# Patient Record
Sex: Female | Born: 1975 | Race: Black or African American | Hispanic: No | Marital: Single | Smoking: Current every day smoker
Health system: Southern US, Community
[De-identification: ages and names within clinical notes are randomized; demographics above are authoritative.]

## PROBLEM LIST (undated history)

## (undated) DIAGNOSIS — F53 Postpartum depression: Secondary | ICD-10-CM

## (undated) DIAGNOSIS — J45909 Unspecified asthma, uncomplicated: Secondary | ICD-10-CM

## (undated) DIAGNOSIS — F99 Mental disorder, not otherwise specified: Secondary | ICD-10-CM

## (undated) DIAGNOSIS — M1990 Unspecified osteoarthritis, unspecified site: Secondary | ICD-10-CM

## (undated) HISTORY — PX: HERNIA REPAIR: SHX51

## (undated) HISTORY — DX: Mental disorder, not otherwise specified: F99

## (undated) HISTORY — DX: Postpartum depression: F53.0

---

## 2020-02-24 ENCOUNTER — Inpatient Hospital Stay (HOSPITAL_COMMUNITY): Payer: Medicaid Other

## 2020-02-24 ENCOUNTER — Other Ambulatory Visit: Payer: Self-pay

## 2020-02-24 ENCOUNTER — Encounter (HOSPITAL_COMMUNITY): Payer: Self-pay | Admitting: Obstetrics & Gynecology

## 2020-02-24 ENCOUNTER — Inpatient Hospital Stay (HOSPITAL_COMMUNITY)
Admission: AD | Admit: 2020-02-24 | Discharge: 2020-02-24 | Disposition: A | Discharge status: Home / Self Care | Payer: Medicaid Other | Attending: Obstetrics & Gynecology | Admitting: Obstetrics & Gynecology

## 2020-02-24 DIAGNOSIS — O2342 Unspecified infection of urinary tract in pregnancy, second trimester: Secondary | ICD-10-CM | POA: Insufficient documentation

## 2020-02-24 DIAGNOSIS — O26891 Other specified pregnancy related conditions, first trimester: Secondary | ICD-10-CM | POA: Insufficient documentation

## 2020-02-24 DIAGNOSIS — O09521 Supervision of elderly multigravida, first trimester: Secondary | ICD-10-CM | POA: Insufficient documentation

## 2020-02-24 DIAGNOSIS — O4691 Antepartum hemorrhage, unspecified, first trimester: Secondary | ICD-10-CM | POA: Diagnosis not present

## 2020-02-24 DIAGNOSIS — A599 Trichomoniasis, unspecified: Secondary | ICD-10-CM | POA: Insufficient documentation

## 2020-02-24 DIAGNOSIS — O98311 Other infections with a predominantly sexual mode of transmission complicating pregnancy, first trimester: Secondary | ICD-10-CM | POA: Insufficient documentation

## 2020-02-24 DIAGNOSIS — M545 Low back pain: Secondary | ICD-10-CM | POA: Insufficient documentation

## 2020-02-24 DIAGNOSIS — O2341 Unspecified infection of urinary tract in pregnancy, first trimester: Secondary | ICD-10-CM | POA: Diagnosis not present

## 2020-02-24 DIAGNOSIS — O209 Hemorrhage in early pregnancy, unspecified: Secondary | ICD-10-CM

## 2020-02-24 DIAGNOSIS — O3680X0 Pregnancy with inconclusive fetal viability, not applicable or unspecified: Secondary | ICD-10-CM | POA: Diagnosis not present

## 2020-02-24 DIAGNOSIS — Z3A13 13 weeks gestation of pregnancy: Secondary | ICD-10-CM | POA: Insufficient documentation

## 2020-02-24 LAB — ABO/RH: ABO/RH(D): A POS

## 2020-02-24 LAB — CBC
HCT: 37 % (ref 36.0–46.0)
Hemoglobin: 12.5 g/dL (ref 12.0–15.0)
MCH: 30.5 pg (ref 26.0–34.0)
MCHC: 33.8 g/dL (ref 30.0–36.0)
MCV: 90.2 fL (ref 80.0–100.0)
Platelets: 259 10*3/uL (ref 150–400)
RBC: 4.1 MIL/uL (ref 3.87–5.11)
RDW: 12.7 % (ref 11.5–15.5)
WBC: 7.9 10*3/uL (ref 4.0–10.5)
nRBC: 0 % (ref 0.0–0.2)

## 2020-02-24 LAB — URINALYSIS, ROUTINE W REFLEX MICROSCOPIC
Bilirubin Urine: NEGATIVE
Glucose, UA: NEGATIVE mg/dL
Ketones, ur: NEGATIVE mg/dL
Nitrite: POSITIVE — AB
Protein, ur: 100 mg/dL — AB
Specific Gravity, Urine: 1.028 (ref 1.005–1.030)
pH: 5 (ref 5.0–8.0)

## 2020-02-24 LAB — WET PREP, GENITAL
Clue Cells Wet Prep HPF POC: NONE SEEN
Sperm: NONE SEEN
Yeast Wet Prep HPF POC: NONE SEEN

## 2020-02-24 LAB — POCT PREGNANCY, URINE: Preg Test, Ur: POSITIVE — AB

## 2020-02-24 LAB — HCG, QUANTITATIVE, PREGNANCY: hCG, Beta Chain, Quant, S: 16930 m[IU]/mL — ABNORMAL HIGH (ref <5)

## 2020-02-24 MED ORDER — CEPHALEXIN 500 MG PO CAPS
500.0000 mg | ORAL_CAPSULE | Freq: Four times a day (QID) | ORAL | 0 refills | Status: DC
Start: 2020-02-24 — End: 2020-03-15

## 2020-02-24 MED ORDER — METRONIDAZOLE 500 MG PO TABS
2000.0000 mg | ORAL_TABLET | Freq: Once | ORAL | Status: AC
  Administered 2020-02-24: 2000 mg via ORAL
  Filled 2020-02-24: qty 4

## 2020-02-24 NOTE — MAU Note (Signed)
Bailey Delgado is a 44 y.o. 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 Did not see any bleeding when using bathroom in MAU. Having some lower back pain.  LMP: 11/23/2019  Onset of complaint: today  Pain score: 1/10  Vitals:   02/24/20 1614  BP: 134/89  Pulse: 97  Resp: 16  Temp: 98.3 F (36.8 C)  SpO2: 99%     Lab orders placed from triage: UPT UA

## 2020-02-24 NOTE — Discharge Instructions (Signed)
Trichomoniasis Trichomoniasis is an STI (sexually transmitted infection) that can affect both women and men. In women, the outer area of the female genitalia (vulva) and the vagina are affected. In men, mainly the penis is affected, but the prostate and other reproductive organs can also be involved.  This condition can be treated with medicine. It often has no symptoms (is asymptomatic), especially in men. If not treated, trichomoniasis can last for months or years. What are the causes? This condition is caused by a parasite called Trichomonas vaginalis. Trichomoniasis most often spreads from person to person (is contagious) through sexual contact. What increases the risk? The following factors may make you more likely to develop this condition:  Having unprotected sex.  Having sex with a partner who has trichomoniasis.  Having multiple sexual partners.  Having had previous trichomoniasis infections or other STIs. What are the signs or symptoms? In women, symptoms of trichomoniasis include:  Abnormal vaginal discharge that is clear, white, gray, or yellow-green and foamy and has an unusual "fishy" odor.  Itching and irritation of the vagina and vulva.  Burning or pain during urination or sex.  Redness and swelling of the genitals. In men, symptoms of trichomoniasis include:  Penile discharge that may be foamy or contain pus.  Pain in the penis. This may happen only when urinating.  Itching or irritation inside the penis.  Burning after urination or ejaculation. How is this diagnosed? In women, this condition may be found during a routine Pap test or physical exam. It may be found in men during a routine physical exam. Your health care provider may do tests to help diagnose this infection, such as:  Urine tests (men and women).  The following in women: ? Testing the pH of the vagina. ? A vaginal swab test that checks for the Trichomonas vaginalis parasite. ? Testing vaginal  secretions. Your health care provider may test you for other STIs, including HIV (human immunodeficiency virus). How is this treated? This condition is treated with medicine taken by mouth (orally), such as metronidazole or tinidazole, to fight the infection. Your sexual partner(s) also need to be tested and treated.  If you are a woman and you plan to become pregnant or think you may be pregnant, tell your health care provider right away. Some medicines that are used to treat the infection should not be taken during pregnancy. Your health care provider may recommend over-the-counter medicines or creams to help relieve itching or irritation. You may be tested for infection again 3 months after treatment. Follow these instructions at home:  Take and use over-the-counter and prescription medicines, including creams, only as told by your health care provider.  Take your antibiotic medicine as told by your health care provider. Do not stop taking the antibiotic even if you start to feel better.  Do not have sex until 7-10 days after you finish your medicine, or until your health care provider approves. Ask your health care provider when you may start to have sex again.  (Women) Do not douche or wear tampons while you have the infection.  Discuss your infection with your sexual partner(s). Make sure that your partner gets tested and treated, if necessary.  Keep all follow-up visits as told by your health care provider. This is important. How is this prevented?   Use condoms every time you have sex. Using condoms correctly and consistently can help protect against STIs.  Avoid having multiple sexual partners.  Talk with your sexual partner about any   symptoms that either of you may have, as well as any history of STIs.  Get tested for STIs and STDs (sexually transmitted diseases) before you have sex. Ask your partner to do the same.  Do not have sexual contact if you have symptoms of  trichomoniasis or another STI. Contact a health care provider if:  You still have symptoms after you finish your medicine.  You develop pain in your abdomen.  You have pain when you urinate.  You have bleeding after sex.  You develop a rash.  You feel nauseous or you vomit.  You plan to become pregnant or think you may be pregnant. Summary  Trichomoniasis is an STI (sexually transmitted infection) that can affect both women and men.  This condition often has no symptoms (is asymptomatic), especially in men.  Without treatment, this condition can last for months or years.  You should not have sex until 7-10 days after you finish your medicine, or until your health care provider approves. Ask your health care provider when you may start to have sex again.  Discuss your infection with your sexual partner(s). Make sure that your partner gets tested and treated, if necessary. This information is not intended to replace advice given to you by your health care provider. Make sure you discuss any questions you have with your health care provider. Document Revised: 05/10/2018 Document Reviewed: 05/10/2018 Elsevier Patient Education  2020 [employer]   Pregnancy and Urinary Tract Infection  A urinary tract infection (UTI) is an infection of any part of the urinary tract. This includes the kidneys, the tubes that connect your kidneys to your bladder (ureters), the bladder, and the tube that carries urine out of your body (urethra). These organs make, store, and get rid of urine in the body. Your health care provider may use other names to describe the infection. An upper UTI affects the ureters and kidneys (pyelonephritis). A lower UTI affects the bladder (cystitis) and urethra (urethritis). Most urinary tract infections are caused by bacteria in your genital area, around the entrance to your urinary tract (urethra). These bacteria grow and cause irritation and inflammation of your urinary  tract. You are more likely to develop a UTI during pregnancy because the physical and hormonal changes your body goes through can make it easier for bacteria to get into your urinary tract. Your growing baby also puts pressure on your bladder and can affect urine flow. It is important to recognize and treat UTIs in pregnancy because of the risk of serious complications for both you and your baby. How does this affect me? Symptoms of a UTI include:  Needing to urinate right away (urgently).  Frequent urination or passing small amounts of urine frequently.  Pain or burning with urination.  Blood in the urine.  Urine that smells bad or unusual.  Trouble urinating.  Cloudy urine.  Pain in the abdomen or lower back.  Vaginal discharge. You may also have:  Vomiting or a decreased appetite.  Confusion.  Irritability or tiredness.  A fever.  Diarrhea. How does this affect my baby? An untreated UTI during pregnancy could lead to a kidney infection or a systemic infection, which can cause health problems that could affect your baby. Possible complications of an untreated UTI include:  Giving birth to your baby before 37 weeks of pregnancy (premature).  Having a baby with a low birth weight.  Developing high blood pressure during pregnancy (preeclampsia).  Having a low hemoglobin level (anemia). What can I  do to lower my risk? To prevent a UTI:  Go to the bathroom as soon as you feel the need. Do not hold urine for long periods of time.  Always wipe from front to back, especially after a bowel movement. Use each tissue one time when you wipe.  Empty your bladder after sex.  Keep your genital area dry.  Drink 6-10 glasses of water each day.  Do not douche or use deodorant sprays. How is this treated? Treatment for this condition may include:  Antibiotic medicines that are safe to take during pregnancy.  Other medicines to treat less common causes of UTI. Follow these  instructions at home:  If you were prescribed an antibiotic medicine, take it as told by your health care provider. Do not stop using the antibiotic even if you start to feel better.  Keep all follow-up visits as told by your health care provider. This is important. Contact a health care provider if:  Your symptoms do not improve or they get worse.  You have abnormal vaginal discharge. Get help right away if you:  Have a fever.  Have nausea and vomiting.  Have back or side pain.  Feel contractions in your uterus.  Have lower belly pain.  Have a gush of fluid from your vagina.  Have blood in your urine. Summary  A urinary tract infection (UTI) is an infection of any part of the urinary tract, which includes the kidneys, ureters, bladder, and urethra.  Most urinary tract infections are caused by bacteria in your genital area, around the entrance to your urinary tract (urethra).  You are more likely to develop a UTI during pregnancy.  If you were prescribed an antibiotic medicine, take it as told by your health care provider. Do not stop using the antibiotic even if you start to feel better. This information is not intended to replace advice given to you by your health care provider. Make sure you discuss any questions you have with your health care provider. Document Revised: 11/18/2018 Document Reviewed: 06/30/2018 Elsevier Patient Education  2020 [employer].

## 2020-02-24 NOTE — MAU Provider Note (Signed)
History     CSN: [id number]  Arrival date and time: 02/24/20 1551   Chief Complaint  Patient presents with   Back Pain   Vaginal Bleeding   HPI Bailey Delgado is a 44 y.o. [id number] at approximately [redacted]w[redacted]d by unsure LMP who presents with bleeding for the last 2 days that started while she was working. She passed one small clot. She also reports some intermittent lower back pain that she rates a 1/10. She has not tried anything for the pain. She has not been seen anywhere in the pregnancy so far.    OB History    Gravida  4   Para  1   Term      Preterm  1   AB  1   Living  1     SAB  1   TAB      Ectopic      Multiple      Live Births  2           No past medical history on file.  History reviewed. No pertinent surgical history.  No family history on file.  Social History   Tobacco Use   Smoking status: Not on file  Substance Use Topics   Alcohol use: Not on file   Drug use: Not on file    Allergies: No Known Allergies  No medications prior to admission.    Review of Systems  Constitutional: Negative.  Negative for fatigue and fever.  HENT: Negative.   Respiratory: Negative.  Negative for shortness of breath.   Cardiovascular: Negative.  Negative for chest pain.  Gastrointestinal: Negative for abdominal pain, constipation, diarrhea, nausea and vomiting.  Genitourinary: Positive for vaginal bleeding. Negative for dysuria and vaginal discharge.  Musculoskeletal: Positive for back pain.  Neurological: Negative.  Negative for dizziness and headaches.   Physical Exam   Blood pressure 134/89, pulse 97, temperature 98.3 F (36.8 C), temperature source Oral, resp. rate 16, height 5\' 6"  (1.676 m), weight 108.3 kg, last menstrual period 11/23/2019, SpO2 99 %.  Physical Exam Vitals and nursing note reviewed.  Constitutional:      General: She is not in acute distress.    Appearance: She is well-developed.  HENT:     Head: Normocephalic.   Eyes:     Pupils: Pupils are equal, round, and reactive to light.  Cardiovascular:     Rate and Rhythm: Normal rate and regular rhythm.     Heart sounds: Normal heart sounds.  Pulmonary:     Effort: Pulmonary effort is normal. No respiratory distress.     Breath sounds: Normal breath sounds.  Abdominal:     General: Bowel sounds are normal. There is no distension.     Palpations: Abdomen is soft.     Tenderness: There is no abdominal tenderness.  Genitourinary:    Comments: SSE: scant bleeding in vaginal vault Skin:    General: Skin is warm and dry.  Neurological:     Mental Status: She is alert and oriented to person, place, and time.  Psychiatric:        Behavior: Behavior normal.        Thought Content: Thought content normal.        Judgment: Judgment normal.     MAU Course  Procedures Results for orders placed or performed during the hospital encounter of 02/24/20 (from the past 24 hour(s))  Pregnancy, urine POC     Status: Abnormal   Collection Time: 02/24/20  4:08  PM  Result Value Ref Range   Preg Test, Ur POSITIVE (A) NEGATIVE  Urinalysis, Routine w reflex microscopic     Status: Abnormal   Collection Time: 02/24/20  4:10 PM  Result Value Ref Range   Color, Urine AMBER (A) YELLOW   APPearance HAZY (A) CLEAR   Specific Gravity, Urine 1.028 1.005 - 1.030   pH 5.0 5.0 - 8.0   Glucose, UA NEGATIVE NEGATIVE mg/dL   Hgb urine dipstick LARGE (A) NEGATIVE   Bilirubin Urine NEGATIVE NEGATIVE   Ketones, ur NEGATIVE NEGATIVE mg/dL   Protein, ur 919 (A) NEGATIVE mg/dL   Nitrite POSITIVE (A) NEGATIVE   Leukocytes,Ua SMALL (A) NEGATIVE   RBC / HPF 11-20 0 - 5 RBC/hpf   WBC, UA 21-50 0 - 5 WBC/hpf   Bacteria, UA MANY (A) NONE SEEN   Squamous Epithelial / LPF 6-10 0 - 5   Mucus PRESENT    Hyaline Casts, UA PRESENT   ABO/Rh     Status: None   Collection Time: 02/24/20  4:29 PM  Result Value Ref Range   ABO/RH(D) A POS    No rh immune globuloin      NOT A RH IMMUNE  GLOBULIN CANDIDATE, PT RH POSITIVE Performed at [facility], [address], [place], [place] [address]   CBC     Status: None   Collection Time: 02/24/20  4:29 PM  Result Value Ref Range   WBC 7.9 4.0 - 10.5 K/uL   RBC 4.10 3.87 - 5.11 MIL/uL   Hemoglobin 12.5 12.0 - 15.0 g/dL   HCT 00.4 36 - 46 %   MCV 90.2 80.0 - 100.0 fL   MCH 30.5 26.0 - 34.0 pg   MCHC 33.8 30.0 - 36.0 g/dL   RDW 59.9 77.4 - 14.2 %   Platelets 259 150 - 400 K/uL   nRBC 0.0 0.0 - 0.2 %  hCG, quantitative, pregnancy     Status: Abnormal   Collection Time: 02/24/20  4:29 PM  Result Value Ref Range   hCG, Beta Chain, Quant, S 16,930 (H) <5 mIU/mL  Wet prep, genital     Status: Abnormal   Collection Time: 02/24/20  4:40 PM   Specimen: Vaginal  Result Value Ref Range   Yeast Wet Prep HPF POC NONE SEEN NONE SEEN   Trich, Wet Prep PRESENT (A) NONE SEEN   Clue Cells Wet Prep HPF POC NONE SEEN NONE SEEN   WBC, Wet Prep HPF POC MODERATE (A) NONE SEEN   Sperm NONE SEEN    CLINICAL DATA: Spotting lower back pain, positive urine pregnancy test  EXAM: OBSTETRIC <14 WK [place] AND TRANSVAGINAL OB US  TECHNIQUE: Both transabdominal and transvaginal ultrasound examinations were performed for complete evaluation of the gestation as well as the maternal uterus, adnexal regions, and pelvic cul-de-sac. Transvaginal technique was performed to assess early pregnancy.  COMPARISON: None.  FINDINGS: Intrauterine gestational sac: Single with internal probable debris.  Yolk sac: Not Visualized.  Embryo: Not Visualized.  MSD: 25 mm 7 w 3 d  Subchorionic hemorrhage: There is a small subchorionic hemorrhage.  Maternal uterus/adnexae: Normal appearing ovaries. No free fluid in the cul-de-sac.  IMPRESSION: Probable early intrauterine gestational sac, but no yolk sac, fetal pole, or cardiac activity yet visualized. Recommend follow-up quantitative B-HCG levels and follow-up US in 14 days to assess viability. This  recommendation follows SRU consensus guidelines: Diagnostic Criteria for Nonviable Pregnancy Early in the First Trimester. N Engl J Med 2013;  086:7619-50.   Electronically Signed By: Jonna Clark M.D. On: 02/24/2020 17:30  MDM Unable to doppler FHT- will rule out ectopic UA, UPT, UC CBC, HCG ABO/Rh- A Pos Wet prep and gc/chlamydia US OB Comp Less 14 weeks with Transvaginal  Metronidazole 2g  Consulted with Dr. Macon Large regarding ultrasound and HCG results- MD reviewed images and suspicious of failed pregnancy. Recommends repeat u/s in a week for confirmation  Assessment and Plan   1. Pregnancy, location unknown   2. Vaginal bleeding affecting early pregnancy   3. Trichomoniasis   4. Urinary tract infection in mother during first trimester of pregnancy    -Discharge home in stable condition -Rx for keflex sent to patient's pharmacy -First trimester precautions discussed -Patient advised to follow-up with [facility] in 1 week for repeat ultrasound, order placed -Patient may return to MAU as needed or if her condition were to change or worsen   Rolm Bookbinder CNM 02/24/2020, 6:28 PM

## 2020-02-26 ENCOUNTER — Inpatient Hospital Stay (HOSPITAL_COMMUNITY): Payer: Medicaid Other

## 2020-02-26 ENCOUNTER — Encounter (HOSPITAL_COMMUNITY): Payer: Self-pay | Admitting: Obstetrics & Gynecology

## 2020-02-26 ENCOUNTER — Inpatient Hospital Stay (HOSPITAL_COMMUNITY)
Admission: AD | Admit: 2020-02-26 | Discharge: 2020-02-26 | Disposition: A | Discharge status: Home / Self Care | Payer: Medicaid Other | Attending: Obstetrics & Gynecology | Admitting: Obstetrics & Gynecology

## 2020-02-26 DIAGNOSIS — O039 Complete or unspecified spontaneous abortion without complication: Secondary | ICD-10-CM | POA: Insufficient documentation

## 2020-02-26 DIAGNOSIS — F17210 Nicotine dependence, cigarettes, uncomplicated: Secondary | ICD-10-CM | POA: Diagnosis not present

## 2020-02-26 DIAGNOSIS — O209 Hemorrhage in early pregnancy, unspecified: Secondary | ICD-10-CM | POA: Diagnosis present

## 2020-02-26 HISTORY — DX: Unspecified osteoarthritis, unspecified site: M19.90

## 2020-02-26 HISTORY — DX: Unspecified asthma, uncomplicated: J45.909

## 2020-02-26 LAB — HEMOGLOBIN AND HEMATOCRIT, BLOOD
HCT: 34.5 % — ABNORMAL LOW (ref 36.0–46.0)
Hemoglobin: 11.7 g/dL — ABNORMAL LOW (ref 12.0–15.0)

## 2020-02-26 LAB — CULTURE, OB URINE: Culture: >=100000 — AB

## 2020-02-26 LAB — GC/CHLAMYDIA PROBE AMP (~~LOC~~) NOT AT ARMC
Chlamydia: NEGATIVE
Comment: NEGATIVE
Comment: NORMAL
Neisseria Gonorrhea: NEGATIVE

## 2020-02-26 LAB — HCG, QUANTITATIVE, PREGNANCY: hCG, Beta Chain, Quant, S: 10371 m[IU]/mL — ABNORMAL HIGH (ref <5)

## 2020-02-26 MED ORDER — KETOROLAC TROMETHAMINE 30 MG/ML IJ SOLN
30.0000 mg | Freq: Once | INTRAMUSCULAR | Status: AC
  Administered 2020-02-26: 30 mg via INTRAVENOUS
  Filled 2020-02-26: qty 1

## 2020-02-26 MED ORDER — MISOPROSTOL 200 MCG PO TABS
1000.0000 ug | ORAL_TABLET | Freq: Once | ORAL | Status: AC
  Administered 2020-02-26: 1000 ug via BUCCAL
  Filled 2020-02-26: qty 5

## 2020-02-26 MED ORDER — HYDROMORPHONE HCL 1 MG/ML IJ SOLN
1.0000 mg | Freq: Once | INTRAMUSCULAR | Status: AC
  Administered 2020-02-26: 1 mg via INTRAMUSCULAR
  Filled 2020-02-26: qty 1

## 2020-02-26 MED ORDER — OXYCODONE HCL 5 MG PO TABS: 5.0000 mg | ORAL_TABLET | Freq: Three times a day (TID) | ORAL | 0 refills | Status: DC | PRN

## 2020-02-26 MED ORDER — OXYCODONE HCL 5 MG PO TABS: 5.0000 mg | ORAL_TABLET | Freq: Three times a day (TID) | ORAL | 0 refills | Status: AC | PRN

## 2020-02-26 NOTE — Discharge Instructions (Signed)
Return to care  °· If you have heavier bleeding that soaks through more that 2 pads per hour for an hour or more °· If you bleed so much that you feel like you might pass out or you do pass out °· If you have significant abdominal pain that is not improved with Tylenol  °· If you develop a fever > 100.5 ° ° °Miscarriage °A miscarriage is the loss of an unborn baby (fetus) before the 20th week of pregnancy. Most miscarriages happen during the first 3 months of pregnancy. Sometimes, a miscarriage can happen before a woman knows that she is pregnant. °Having a miscarriage can be an emotional experience. If you have had a miscarriage, talk with your health care provider about any questions you may have about miscarrying, the grieving process, and your plans for future pregnancy. °What are the causes? °A miscarriage may be caused by: °· Problems with the genes or chromosomes of the fetus. These problems make it impossible for the baby to develop normally. They are often the result of random errors that occur early in the development of the baby, and are not passed from parent to child (not inherited). °· Infection of the cervix or uterus. °· Conditions that affect hormone balance in the body. °· Problems with the cervix, such as the cervix opening and thinning before pregnancy is at term (cervical insufficiency). °· Problems with the uterus. These may include: °? A uterus with an abnormal shape. °? Fibroids in the uterus. °? Congenital abnormalities. These are problems that were present at birth. °· Certain medical conditions. °· Smoking, drinking alcohol, or using drugs. °· Injury (trauma). °In many cases, the cause of a miscarriage is not known. °What are the signs or symptoms? °Symptoms of this condition include: °· Vaginal bleeding or spotting, with or without cramps or pain. °· Pain or cramping in the abdomen or lower back. °· Passing fluid, tissue, or blood clots from the vagina. °How is this diagnosed? °This  condition may be diagnosed based on: °· A physical exam. °· Ultrasound. °· Blood tests. °· Urine tests. °How is this treated? °Treatment for a miscarriage is sometimes not necessary if you naturally pass all the tissue that was in your uterus. If necessary, this condition may be treated with: °· Dilation and curettage (D&C). This is a procedure in which the cervix is stretched open and the lining of the uterus (endometrium) is scraped. This is done only if tissue from the fetus or placenta remains in the body (incomplete miscarriage). °· Medicines, such as: °? Antibiotic medicine, to treat infection. °? Medicine to help the body pass any remaining tissue. °? Medicine to reduce (contract) the size of the uterus. These medicines may be given if you have a lot of bleeding. °If you have Rh negative blood and your baby was Rh positive, you will need a shot of a medicine called Rh immunoglobulinto protect your future babies from Rh blood problems. "Rh-negative" and "Rh-positive" refer to whether or not the blood has a specific protein found on the surface of red blood cells (Rh factor). °Follow these instructions at home: °Medicines ° °· Take over-the-counter and prescription medicines only as told by your health care provider. °· If you were prescribed antibiotic medicine, take it as told by your health care provider. Do not stop taking the antibiotic even if you start to feel better. °· Do not take NSAIDs, such as aspirin and ibuprofen, unless they are approved by your health care provider. These   care provider. These medicines can cause bleeding. Activity  Rest as directed. Ask your health care provider what activities are safe for you.  Have someone help with home and family responsibilities during this time. General instructions  Keep track of the number of sanitary pads you use each day and how soaked (saturated) they are. Write down this information.  Monitor the amount of tissue or blood clots that you pass from your vagina.  Save any large amounts of tissue for your health care provider to examine.  Do not use tampons, douche, or have sex until your health care provider approves.  To help you and your partner with the process of grieving, talk with your health care provider or seek counseling.  When you are ready, meet with your health care provider to discuss any important steps you should take for your health. Also, discuss steps you should take to have a healthy pregnancy in the future.  Keep all follow-up visits as told by your health care provider. This is important. Where to find more information  The American Congress of Obstetricians and Gynecologists: www.acog.org  U.S. Department of Health and Cytogeneticist of Women's Health: http://hoffman.com/ Contact a health care provider if:  You have a fever or chills.  You have a foul smelling vaginal discharge.  You have more bleeding instead of less. Get help right away if:  You have severe cramps or pain in your back or abdomen.  You pass blood clots or tissue from your vagina that is walnut-sized or larger.  You soak more than 1 regular sanitary pad in an hour.  You become light-headed or weak.  You pass out.  You have feelings of sadness that take over your thoughts, or you have thoughts of hurting yourself. Summary  Most miscarriages happen in the first 3 months of pregnancy. Sometimes miscarriage happens before a woman even knows that she is pregnant.  Follow your health care provider's instruction for home care. Keep all follow-up appointments.  To help you and your partner with the process of grieving, talk with your health care provider or seek counseling. This information is not intended to replace advice given to you by your health care provider. Make sure you discuss any questions you have with your health care provider. Document Revised: 11/18/2018 Document Reviewed: 09/01/2016 Elsevier Patient Education  2020 [employer].

## 2020-02-26 NOTE — MAU Provider Note (Signed)
Chief Complaint: Vaginal Bleeding   First Provider Initiated Contact with Patient 02/26/20 0926     SUBJECTIVE HPI: Bailey Delgado is a 44 y.o. [id number] at [redacted]w[redacted]d who presents to Maternity Admissions reporting abdominal pain & vaginal bleeding. Was seen in MAU on Saturday and diagnosed with probable miscarriage. Symptoms worsened this morning around 5 am. Reports intense abdominal pain that she rates 8/10. Has been passing large clots this morning as well. Has saturated 3 pads in the last 2 hours.   Location: abdomen Quality: cramping Severity: 8/10 on pain scale Duration: 5 hours Timing: intermittent Modifying factors: none Associated signs and symptoms: vaginal bleeding  Past Medical History:  Diagnosis Date  . Arthritis   . Asthma    OB History  Gravida Para Term Preterm AB Living  4 1   1 1 1   SAB TAB Ectopic Multiple Live Births  1       2    # Outcome Date GA Lbr Len/2nd Weight Sex Delivery Anes PTL Lv  4 Current           3 Preterm 07/02/13    M CS-Unspec   DEC  2 Gravida 01/08/09    M    LIV  1 SAB 02/23/97           Past Surgical History:  Procedure Laterality Date  . CESAREAN SECTION    . HERNIA REPAIR     Social History   Socioeconomic History  . Marital status: Single    Spouse name: Not on file  . Number of children: Not on file  . Years of education: Not on file  . Highest education level: Not on file  Occupational History  . Not on file  Tobacco Use  . Smoking status: Current Every Day Smoker    Packs/day: 0.50    Types: Cigarettes  . Smokeless tobacco: Never Used  Substance and Sexual Activity  . Alcohol use: Not Currently  . Drug use: Not Currently    Types: Marijuana  . Sexual activity: Yes  Other Topics Concern  . Not on file  Social History Narrative  . Not on file   Social Determinants of Health   Financial Resource Strain:   . Difficulty of Paying Living Expenses:   Food Insecurity:   . Worried About 02/25/97 in the  Last Year:   . Programme researcher, broadcasting/film/video in the Last Year:   Transportation Needs:   . Barista (Medical):   Freight forwarder Lack of Transportation (Non-Medical):   Physical Activity:   . Days of Exercise per Week:   . Minutes of Exercise per Session:   Stress:   . Feeling of Stress :   Social Connections:   . Frequency of Communication with Friends and Family:   . Frequency of Social Gatherings with Friends and Family:   . Attends Religious Services:   . Active Member of Clubs or Organizations:   . Attends Marland Kitchen Meetings:   Banker Marital Status:   Intimate Partner Violence:   . Fear of Current or Ex-Partner:   . Emotionally Abused:   Marland Kitchen Physically Abused:   . Sexually Abused:    History reviewed. No pertinent family history. No current facility-administered medications on file prior to encounter.   Current Outpatient Medications on File Prior to Encounter  Medication Sig Dispense Refill  . cephALEXin (KEFLEX) 500 MG capsule Take 1 capsule (500 mg total) by mouth 4 (four) times daily. 20 capsule 0  No Known Allergies  I have reviewed patient's Past Medical Hx, Surgical Hx, Family Hx, Social Hx, medications and allergies.   Review of Systems  Constitutional: Negative.   Gastrointestinal: Positive for abdominal pain. Negative for diarrhea, nausea and vomiting.  Genitourinary: Positive for vaginal bleeding. Negative for dysuria and vaginal discharge.    OBJECTIVE Patient Vitals for the past 24 hrs:  BP Temp Temp src Pulse Resp SpO2 Height Weight  02/26/20 1411 133/84 98.6 F (37 C) Oral 78 16 100 % -- --  02/26/20 0859 132/75 98.2 F (36.8 C) Oral 76 20 99 % 5\' 6"  (1.676 m) 110.3 kg   Constitutional: Well-developed, well-nourished female in no acute distress.  Cardiovascular: normal rate & rhythm, no murmur Respiratory: normal rate and effort. Lung sounds clear throughout GI: Abd soft, non-tender, Pos BS x 4. No guarding or rebound tenderness MS: Extremities  nontender, no edema, normal ROM Neurologic: Alert and oriented x 4.  GU:     SPECULUM EXAM: NEFG, moderate amount of dark red blood. No tissue.   BIMANUAL: No CMT. cervix closed; uterus normal size, no adnexal tenderness or masses.    LAB RESULTS Results for orders placed or performed during the hospital encounter of 02/26/20 (from the past 24 hour(s))  Hemoglobin and hematocrit, blood     Status: Abnormal   Collection Time: 02/26/20 10:04 AM  Result Value Ref Range   Hemoglobin 11.7 (L) 12.0 - 15.0 g/dL   HCT 02/28/20 (L) 36 - 46 %  hCG, quantitative, pregnancy     Status: Abnormal   Collection Time: 02/26/20 10:04 AM  Result Value Ref Range   hCG, Beta Chain, Quant, S 10,371 (H) <5 mIU/mL    IMAGING 02/28/20 OB Transvaginal  Result Date: 02/26/2020 CLINICAL DATA:  Increase in vaginal bleeding.  Patient is pregnant. EXAM: TRANSVAGINAL OB ULTRASOUND TECHNIQUE: Transvaginal ultrasound was performed for complete evaluation of the gestation as well as the maternal uterus, adnexal regions, and pelvic cul-de-sac. COMPARISON:  02/24/2020 FINDINGS: Intrauterine gestational sac: Present but located in the lower uterine segment. Yolk sac:  None Embryo:  None Cardiac Activity: None Heart Rate: N/A bpm MSD: 20.7 mm   6 w   6 d Subchorionic hemorrhage:  N/A Maternal uterus/adnexae: Ovaries are not visualized. IMPRESSION: Findings consistent with an ongoing spontaneous abortion. The intrauterine gestational sac is now in the lower uterine segment and no embryonic pole or yolk sac is identified. Electronically Signed   By: 02/26/2020 M.D.   On: 02/26/2020 10:50    MAU COURSE Orders Placed This Encounter  Procedures  . 02/28/2020 OB Transvaginal  . Hemoglobin and hematocrit, blood  . hCG, quantitative, pregnancy  . Saline lock IV  . Discharge patient   Meds ordered this encounter  Medications  . HYDROmorphone (DILAUDID) injection 1 mg  . misoprostol (CYTOTEC) tablet 1,000 mcg  . ketorolac (TORADOL) 30 MG/ML  injection 30 mg  . DISCONTD: oxyCODONE (ROXICODONE) 5 MG immediate release tablet    Sig: Take 1 tablet (5 mg total) by mouth every 8 (eight) hours as needed for up to 3 days for breakthrough pain.    Dispense:  9 tablet    Refill:  0    Order Specific Question:   Supervising Provider    Answer:   [place] A [[id number]]  . oxyCODONE (ROXICODONE) 5 MG immediate release tablet    Sig: Take 1 tablet (5 mg total) by mouth every 8 (eight) hours as needed for up to 3 days for  breakthrough pain.    Dispense:  9 tablet    Refill:  0    Order Specific Question:   Supervising Provider    Answer:   Warden Fillers [[id number]]    MDM Ultrasound on Saturday shows IUGS with internal debris & pt had an HCG of 16,930.  Likely miscarriage in progress. Will repeat labs & ultrasound today.  Dilaudid 1 mg IM for pain.   RH positive  Ultrasound shows empty IUGS in lower uterine segment consistent with miscarriage in progress. HCG down to 10,371. Hemoglobin stable.   Patient given cytotec 1000 mcg given in MAU & patient observed for 2 hours to assess bleeding. Bleeding stable & pain well controlled. Will discharge home.   ASSESSMENT 1. Miscarriage   2. Vaginal bleeding in pregnancy, first trimester     PLAN Discharge home in stable condition. Rx oxycodone for breakthrough pain. Pt instructed to take tylenol or ibuprofen initiall Reviewed bleeding precautions & reasons to return to MAU Msg to [facility] for SAB follow up appointment    Follow-up Information    [facility] Follow up.   Specialty: Obstetrics and Gynecology Why: the office will call you to schedule a follow up appointment. Return to MAU for worsening symptoms Contact information: [address] [place] [address] [phone]             Allergies as of 02/26/2020   No Known Allergies     Medication List    TAKE these medications   cephALEXin 500 MG  capsule Commonly known as: KEFLEX Take 1 capsule (500 mg total) by mouth 4 (four) times daily.   oxyCODONE 5 MG immediate release tablet Commonly known as: Roxicodone Take 1 tablet (5 mg total) by mouth every 8 (eight) hours as needed for up to 3 days for breakthrough pain.        Judeth Horn, NP 02/26/2020  3:18 PM

## 2020-02-26 NOTE — MAU Note (Signed)
.   Bailey Delgado is a 44 y.o. at 105w5d here in MAU reporting: Lower abdominal pain and vaginal bleeding since Saturday. She states that the pain and bleeding became worse around 0500 this morning. She passed two clots the size of an egg and reports soaking three pads this morning.   Pain score: 8 Vitals:   02/26/20 0859  BP: 132/75  Pulse: 76  Resp: 20  Temp: 98.2 F (36.8 C)  SpO2: 99%      Lab orders placed from triage: UA

## 2020-03-04 ENCOUNTER — Other Ambulatory Visit: Payer: Self-pay | Admitting: General Practice

## 2020-03-04 ENCOUNTER — Other Ambulatory Visit: Payer: Medicaid Other

## 2020-03-04 DIAGNOSIS — O039 Complete or unspecified spontaneous abortion without complication: Secondary | ICD-10-CM

## 2020-03-15 ENCOUNTER — Other Ambulatory Visit: Payer: Self-pay

## 2020-03-15 ENCOUNTER — Encounter: Payer: Self-pay | Admitting: Obstetrics and Gynecology

## 2020-03-15 ENCOUNTER — Ambulatory Visit (INDEPENDENT_AMBULATORY_CARE_PROVIDER_SITE_OTHER): Payer: Medicaid Other | Admitting: Obstetrics and Gynecology

## 2020-03-15 VITALS — BP 143/96 | HR 78 | Ht 66.0 in | Wt 230.4 lb

## 2020-03-15 DIAGNOSIS — F32A Depression, unspecified: Secondary | ICD-10-CM

## 2020-03-15 DIAGNOSIS — O039 Complete or unspecified spontaneous abortion without complication: Secondary | ICD-10-CM | POA: Diagnosis not present

## 2020-03-15 DIAGNOSIS — F411 Generalized anxiety disorder: Secondary | ICD-10-CM | POA: Insufficient documentation

## 2020-03-15 DIAGNOSIS — F329 Major depressive disorder, single episode, unspecified: Secondary | ICD-10-CM | POA: Diagnosis not present

## 2020-03-15 DIAGNOSIS — F419 Anxiety disorder, unspecified: Secondary | ICD-10-CM | POA: Diagnosis not present

## 2020-03-15 MED ORDER — FLUOXETINE HCL 20 MG PO CAPS: 20.0000 mg | ORAL_CAPSULE | Freq: Every day | ORAL | 1 refills | Status: DC

## 2020-03-15 NOTE — Progress Notes (Signed)
Pt states only having  Little pain in back & lower abdomen.No bleeding. Pt.scored 12 on PHQ & 13 on GAD-7, sent ambulatory referral tom Jamie.

## 2020-03-15 NOTE — Patient Instructions (Addendum)
I will contact you with your lab result. You may start fluoxetine (prozac) 1 tablet daily for 7 days, then increase to 2 tablets daily.  [facility] Guide (Revised August 2014)  Insufficient Money for Medicine:           [address]: call "211"   . MAP Program at [facility] Department - [place] [phone] or [place] [phone]            No Primary Care Doctor:  To locate a primary care doctor that accepts your insurance or provides certain services:           [facility] Connect: [phone]           Physician Referral Service: [phone] ask for "My [place]" . If no insurance, you need to see if you qualify for [facility] "orange card", call to set      up appointment for eligibility/enrollment at [phone] or [phone] or visit [facility]. of Health and [employer] ([address], [place] and [address] [place]) to meet with a [facility] enrollment specialist.  Agencies that provide inexpensive (sliding fee scale) medical care:   Marland Kitchen    [facility] - [facility] at [facility] .    [facility]  -  [facility] [phone] .    [facility] Internal Medicine - [phone] .    [facility] & Wellness [phone] .    [facility] for Children [phone] .    [facility] Family [facility] [phone] . [facility] - [facility] @ [place] [phone]-     [phone] . [facility] - [facility] @ [place] [phone] . [facility]: [phone]  . Women's Clinic: [phone]  . Planned Parenthood: [phone]  . Family Services of the [place]    Medicaid-accepting [facility] Providers:           [facility] - [phone] (No Family Planning accepted)          [address], [phone], Mon-Fri 9am-5pm          [facility] [phone] . [address] [place], [address], [place] 8am-5pm, Fri 8am-noon . [facility] - [phone]          [address], Mon-Fri 7:30am-4:30pm          [facility] - [phone]          [address] [place] 8am-5pm          [facility] - [phone] [address], [address]          Only accepts [place] [employer] patients after they have their name applied to their card  Self Pay (no insurance) in [facility]):           Sickle Cell Patients:  . [address] [place], [phone] [facility] Health Internal Medicine: . [address], [place] [phone]       [facility] . [address] [place] [phone]  [facility] Family Practice: . [address] [phone]          [facility]  [address], [phone] [facility] for Children . [address] [place], [phone]           [facility]           [address], [place] [phone]        [facility] - [address]           [phone], Mon-Fri 9am-7pm, [place] 9am-1pm          [facility] - Family Medicine @ [facility]          [address] [place], [phone]          [facility] - [facility]           [address] [phone] [facility] - [facility] . [address] [place] [phone]          [facility]           [address], [phone]  [facility] - [facility]  . [address] [place], [phone] [facility] - [facility] . [address], [phone]  Dr. Julio Sicks           [address], [place], [phone]          [facility]  Urgent Care           [address], [phone]          [facility]             [address] [phone]          [facility]           [address] [place], [phone], 1st & 3rd Saturday every month, 10am-1pm OTHERS:  Faith [employer]  (Immigration [employer] Only)  [phone] (Thursday only) Strategies for finding a Primary Care Provider:  1) Find a Doctor and Pay Out of Pocket  Although you won't have to find out who is covered by your insurance plan, it is a good idea to ask around and get recommendations. You will then need to call the office and see if the doctor you have chosen will accept you as a new patient and what types of options they offer for patients who are self-pay. Some doctors offer discounts or will set up payment plans for their patients who do not have insurance, but you will need to ask so you aren't surprised when you get to your appointment.  2) Contact [place] [employer] - To see if you qualify for "orange card" access to healthcare safety net providers.  Call for appointment for eligibility/enrollment at [phone] or [phone]. (Uninsured, 0-200% FPL, qualifying info)  Applicants for [facility] are first required to see if they are eligible to enroll in the [facility] Marketplace before enrolling in [facility] (and get an exemption if they are not).  GCCN Criteria for acceptance is:  ? Proof of ACA Marketing exemption - form or documentation  ? Valid photo ID (driver's license, state identification card, passport, home country ID)  ? Proof of [facility] residency (e.g. driver's license, lease/landlord information, pay stubs with address, utility bill, bank statement, etc.)  ? Proof of income (1040, last year's tax return, W2,  4 current pay stubs, other income proof)  ? Proof of assets (current bank statement + 3 most recent, disability paperwork, life insurance info, tax value on autos, etc.)  3) Contact Your Local Health  Department  Not all health departments have doctors that can see patients for sick visits, but many do, so it is worth a call to see if yours does. If you don't know where your local health department is, you can check in your phone book. The CDC also has a tool to help you locate your state's health department, and many state websites also have listings of all of their local health departments.  4) Find a Walk-in Clinic  If your illness is not likely to be very severe or complicated, you may want to try a walk in clinic. These are popping up all over the country in pharmacies, drugstores, and shopping centers. They're usually staffed by nurse practitioners or physician assistants that have been trained to treat common illnesses and complaints. They're usually fairly quick and inexpensive. However, if you have serious medical issues or chronic medical problems, these are probably not your best option   STD Testing:           [facility], [place] [facility]           [address], [place], phone [phone] or [phone]           Monday - Friday, call for an appointment          [facility] Department of [facility], [place] [facility]           [address], [place], phone [phone] or [phone]           Monday - Friday, call for an appointment Abuse/Neglect:           [facility] Child Abuse Hotline: 8720941273           [facility] Child Abuse Hotline: 639-475-0228 (After Hours) Emergency Shelter:  [facility] 873-733-2209  [facility] [place]- (315)145-3106  [facility] [place] - [phone]  Youth Focus - Act Together - [phone])830-7160 (ages 41-17)  Homeless Day Shelter @ [employer] - 5347857111   Mammograms - Free at [facility] [phone])221-1765  Maternity Homes:           Room at the [place] of the [facility]: 4064735991   (Homeless mother with children)           Rebec[facility] Alert Services: 867-821-0355 (Mothers only) . Youth Focus: 229-692-2268 (Pregnant 71-34 years old) . Adopt a Mom -(8252563349  [facility]  . [facility] - Lanae Boast . [address], [address] [phone]          [facility]           [address]           [phone]          [facility]           [address], [place]           [phone]          [facility] Dept.           [address], [place]           [phone]  [facility]           [phone]          [facility] - [facility]           [phone]          [facility] in [place]           [address]           [phone], [facility]          [place] Child Abuse Hotline           615-581-0944(336) 507-367-4320           (828)657-2275(336) (305)163-7465 (After Hours)  Behavioral Health Services /Substance Abuse Resources:           Alcohol and Drug Services: 873-228-4227952-835-2363           [facility]: (619) 503-42834076714625          [facility]: [phone](336) 9095920251  . Narcotics Helpline 5812948614- 1-514-218-4653          [facility]: 254-667-2983(336) 854-300-2256           Residential & Outpatient Substance Abuse Program - [facility]: 680-263-3800(712)256-7169 . [facility]  [facility]al [facility] and Wellness Center - 2362616720(336) [phone] Psychological Services:          [facility]: [phone]  . Therapeutic Alternatives: ([phone]          [facility]           [address], [place]           ACCESS LINE: [phone]     (24 Hour) . Mobile Crisis:  . HELPLINES:  [facility] on Mental Illness - [place] 5144299244(336) [phone] [facility] - [place] [phone](800) 858-135-4238 . Walk In [facility]       [facility] - [address] - [place]  [phone]       [facility]  [facility] - [phone] or [phone](336) [phone]  [facility] - [phone] S. [address] - [place] [phone]  [facility] [phone]- [address], [place] [phone](336) [phone]   Dental Assistance:  If unable to pay or uninsured, contact: [facility] to become qualified for the adult dental clinic. Patient must be enrolled in [facility] (uninsured, 0-200% FPL, qualifying info).  Enroll in [facility] first, then see Primary Care Physician assigned to you, the PCP makes a dental referral. [place] [facility] will receive referral and contacts patient for appointment.  Patients with Medicaid           [address], [phone]  [place] [facility] (Children up to 20 + Pregnant Women) - [phone]  [facility] - [address] - [address] [phone](336) [phone]  If unable to pay, or uninsured: contact [facility] [phone] in [place] - (Children only + Pregnant Women), [phone] in [facility]- Children only) to become qualified for the adult dental clinic  Must see if eligible to enroll in [facility] Insurance Marketplace before enrolling into the [facility] (exemption required) [phone] for an appointment)  [email or web address];   [phone].  If not eligible for ACA, then go by Department of Health and Human Services to see if eligible for "orange card."  [address], [place] and [phone]- [address].  Once you get an orange card, you will have a  Primary Care home who will then refer you to dental if needed.     Other IT consultant:   [facility] [phone] (ext [phone])   [address]  Dr. Lawrence [place]se[place] - [phone]   [address]    [place] - [phone]   [facility]           [address] [place], [place], [place], [address]           [phone], Ext. [phone]           2nd and 4th Thursday of the month at 6:30am (Simple extractions only - no  wisdom teeth or surgery) First come/First serve -First 10 clients served           [facility] [place], [place] and [place] residents only)          [address] [place], [place], [address]           [phone]                    [facility] Health Department           [phone]          [facility] Health Department          [phone]         [facility] Health Department - Children's Dental Clinic          [phone]   Transportation Options:  Ambulance - 911 - [phone] per ride Family Member to accompany patient (if stable) [place] Transit Authority - [phone]  PART - [phone]  Taxi - [phone] - [place]  SCAT - [phone] (Application required)  [facility] - [phone]

## 2020-03-15 NOTE — Progress Notes (Signed)
OBGYN OFFICE VISIT NOTE  History:  44 y.o. [id number] here today for follow-up of SAB. She denies any abnormal vaginal discharge, bleeding, and pelvic pain. However, pt reports intense sadness in setting of recent pregnancy loss and other family/financial stressors. No safety concerns. Denies SI, HI and thoughts of self harm. In addition pt recently moved to [facility] and is unfamiliar with the area and what resources are available. Pt also has multiple chronic health conditions and has not yet established with a PCP.  Pt initially presented to [facility] with vaginal bleeding on 7/17. Diagnosis of SAB was then confirmed on 7/19 based on US findings of IUP in lower uterine segment. Last hCG >10,000 on 7/19.  Past Medical History:  Diagnosis Date  . Arthritis   . Asthma     Past Surgical History:  Procedure Laterality Date  . CESAREAN SECTION    . HERNIA REPAIR       Current Outpatient Medications:  .  FLUoxetine (PROZAC) 20 MG capsule, Take 1 capsule (20 mg total) by mouth daily. Increase to 2 tablets daily after 7 days., Disp: 60 capsule, Rfl: 1  The following portions of the patient's history were reviewed and updated as appropriate: allergies, current medications, past family history, past medical history, past social history, past surgical history and problem list.   Health Maintenance:  Last pap: unknown--request for outside records today Last mammogram: unknown--request for outside records today  Review of Systems:  Pertinent items noted in HPI and remainder of comprehensive ROS otherwise negative.   Objective:  Physical Exam BP (!) 143/96 (BP Location: Left Arm)   Pulse 78   Ht 5\' 6"  (1.676 m)   Wt 230 lb 6.4 oz (104.5 kg)   LMP 11/23/2019 (Approximate)   Breastfeeding Unknown Comment: SAB  BMI 37.19 kg/m  CONSTITUTIONAL: Well-developed, well-nourished female in no acute distress. Tearful throughout encounter. HENT:  Normocephalic, atraumatic. Moist mucous  membranes. EYES: Conjunctivae and EOM are normal. Pupils are equal, round. No scleral icterus.  NECK: Normal range of motion, supple, no masses SKIN: Skin is warm and dry. No rash noted. Not diaphoretic. No erythema. No pallor. NEUROLOGIC: Alert and oriented to person, place, and time. No cranial nerve deficit noted. PSYCHIATRIC: Depressed mood and congruent affect. Denies SI, HI and thoughts of self harm. Normal behavior. Normal judgment and thought content. CARDIOVASCULAR: Normal heart rate noted RESPIRATORY: Effort normal and breath sounds normal, no problems with respiration noted ABDOMEN: Soft, no distention noted.   PELVIC: deferred MUSCULOSKELETAL: Normal range of motion. No edema noted.  Labs and Imaging 11/25/2019 OB Transvaginal  Result Date: 02/26/2020 CLINICAL DATA:  Increase in vaginal bleeding.  Patient is pregnant. EXAM: TRANSVAGINAL OB ULTRASOUND TECHNIQUE: Transvaginal ultrasound was performed for complete evaluation of the gestation as well as the maternal uterus, adnexal regions, and pelvic cul-de-sac. COMPARISON:  02/24/2020 FINDINGS: Intrauterine gestational sac: Present but located in the lower uterine segment. Yolk sac:  None Embryo:  None Cardiac Activity: None Heart Rate: N/A bpm MSD: 20.7 mm   6 w   6 d Subchorionic hemorrhage:  N/A Maternal uterus/adnexae: Ovaries are not visualized. IMPRESSION: Findings consistent with an ongoing spontaneous abortion. The intrauterine gestational sac is now in the lower uterine segment and no embryonic pole or yolk sac is identified. Electronically Signed   By: 02/26/2020 M.D.   On: 02/26/2020 10:50   02/28/2020 OB LESS THAN 14 WEEKS WITH OB TRANSVAGINAL  Result Date: 02/24/2020 CLINICAL DATA:  Spotting lower back pain, positive urine  pregnancy test EXAM: OBSTETRIC <14 WK [place] AND TRANSVAGINAL OB US TECHNIQUE: Both transabdominal and transvaginal ultrasound examinations were performed for complete evaluation of the gestation as well as the maternal  uterus, adnexal regions, and pelvic cul-de-sac. Transvaginal technique was performed to assess early pregnancy. COMPARISON:  None. FINDINGS: Intrauterine gestational sac: Single with internal probable debris. Yolk sac:  Not Visualized. Embryo:  Not Visualized. MSD: 25  mm   7 w   3 d Subchorionic hemorrhage:  There is a small subchorionic hemorrhage. Maternal uterus/adnexae: Normal appearing ovaries. No free fluid in the cul-de-sac. IMPRESSION: Probable early intrauterine gestational sac, but no yolk sac, fetal pole, or cardiac activity yet visualized. Recommend follow-up quantitative B-HCG levels and follow-up US in 14 days to assess viability. This recommendation follows SRU consensus guidelines: Diagnostic Criteria for Nonviable Pregnancy Early in the First Trimester. Malva Limes Med 2013; 027:2536-64. Electronically Signed   By: Jonna Clark M.D.   On: 02/24/2020 17:30    Assessment & Plan:   1. Spontaneous Abortion: Pt initially presented to [facility] with vaginal bleeding on 7/17. Diagnosis of SAB was then confirmed on 7/19 based on US findings of IUP in lower uterine segment. At this time, bleeding and abdominal pain have resolved. Pt's prior concern is her mental health as noted below. Given last hCG >10,000 on 7/19, repeat hCG was obtained today. - Beta hCG quant (ref lab) - BH support as noted below  2. Depression, unspecified depression type: Pt reports extreme sadness in the setting of recent pregnancy loss in addition to significant family and financial stress. In addition, pt recently moved to [facility] to be with her 1 year old son, who she had previously sent to live with her good friend given financial stressors. Pt is very distraught regarding this situation and desperately hopes to reconnect with her son in the near future. Reassuringly no safety concerns today, but after extensive conversation pt elects to initiate SSRI today. - Ambulatory referral to Integrated Behavioral Health - script for  fluoxetine 20mg  daily, then increase to 40mg  daily after 1-2 weeks if tolerating well - strict call precautions for safety concerns  Routine preventative health maintenance measures emphasized. Please refer to After Visit Summary for other counseling recommendations.   Return in about 4 weeks (around 04/12/2020) for [facility] for f/u SAB.  06/12/2020, MD OB Fellow, Faculty Practice 03/16/2020 11:45 PM

## 2020-03-16 LAB — BETA HCG QUANT (REF LAB): hCG Quant: 5 m[IU]/mL

## 2020-03-19 NOTE — BH Specialist Note (Deleted)
Integrated Behavioral Health via Telemedicine Video (Caregility) Visit  03/19/2020 Bailey Delgado [id number]  Number of Integrated Behavioral Health visits: 1 Session Start time: 3:45***  Session End time: 4:45*** Total time: {IBH Total UYEB:34356861}  Referring Provider: Lynnda Shields, MD Type of Visit: Video Patient/Family location: [facility] Provider location: [facility]  All persons participating in visit: Patient *** and Bailey Delgado ***   Discussed confidentiality: Yes   I connected with Bailey Delgado and/or Bailey Delgado's {family members:20773} by a video enabled telemedicine application (Caregility) and verified that I am speaking with the correct person using two identifiers.    I discussed that engaging in this virtual visit, they consent to the provision of behavioral healthcare and the services will be billed under their insurance.   Patient and/or legal guardian expressed understanding and consented to virtual visit: Yes   PRESENTING CONCERNS: Patient and/or family reports the following symptoms/concerns: *** Duration of problem: ***; Severity of problem: {Mild/Moderate/Severe:20260}  STRENGTHS (Protective Factors/Coping Skills): ***  GOALS ADDRESSED: Patient will: 1.  Reduce symptoms of: {IBH Symptoms:21014056}  2.  Increase knowledge and/or ability of: {IBH Patient Tools:21014057}  3.  Demonstrate ability to: {IBH Goals:21014053}  INTERVENTIONS: Interventions utilized:  {IBH Interventions:21014054} Standardized Assessments completed: {IBH Screening Tools:21014051}  ASSESSMENT: Patient currently experiencing ***.   Patient may benefit from psychoeducation and brief therapeutic interventions regarding coping with symptoms of *** .  PLAN: 1. Follow up with behavioral health clinician on : *** 2. Behavioral recommendations:  -*** -*** 3. Referral(s): {IBH Referrals:21014055}  I discussed  the assessment and treatment plan with the patient and/or parent/guardian. They were provided an opportunity to ask questions and all were answered. They agreed with the plan and demonstrated an understanding of the instructions.   They were advised to call back or seek an in-person evaluation if the symptoms worsen or if the condition fails to improve as anticipated.   Confirmed patient's address: Yes  Confirmed patient's phone number: Yes  Any changes to demographics: No   Confirmed patient's insurance: Yes  Any changes to patient's insurance: No   I discussed the limitations of evaluation and management by telemedicine and the availability of in person appointments.  I discussed that the purpose of this visit is to provide behavioral health care while limiting exposure to the novel coronavirus.   Discussed there is a possibility of technology failure and discussed alternative modes of communication if that failure occurs.  Bailey Delgado Bailey Delgado

## 2020-03-21 ENCOUNTER — Other Ambulatory Visit: Payer: Self-pay

## 2020-04-17 ENCOUNTER — Ambulatory Visit: Payer: Medicaid Other | Admitting: Obstetrics and Gynecology

## 2020-04-18 ENCOUNTER — Telehealth: Payer: Self-pay | Admitting: Family Medicine

## 2020-04-18 NOTE — Telephone Encounter (Signed)
Attempted to reach patient about rescheduling her appointment from 09/08. Was not able to reach her, or leave a message.

## 2020-05-17 ENCOUNTER — Other Ambulatory Visit: Payer: Self-pay

## 2020-05-17 ENCOUNTER — Encounter (HOSPITAL_COMMUNITY): Payer: Self-pay | Admitting: Emergency Medicine

## 2020-05-17 ENCOUNTER — Ambulatory Visit (HOSPITAL_COMMUNITY)
Admission: EM | Admit: 2020-05-17 | Discharge: 2020-05-17 | Disposition: A | Discharge status: Home / Self Care | Payer: Medicaid Other | Attending: Internal Medicine | Admitting: Internal Medicine

## 2020-05-17 DIAGNOSIS — Z20822 Contact with and (suspected) exposure to covid-19: Secondary | ICD-10-CM | POA: Insufficient documentation

## 2020-05-17 DIAGNOSIS — J4521 Mild intermittent asthma with (acute) exacerbation: Secondary | ICD-10-CM | POA: Insufficient documentation

## 2020-05-17 DIAGNOSIS — J302 Other seasonal allergic rhinitis: Secondary | ICD-10-CM | POA: Diagnosis present

## 2020-05-17 LAB — POCT RAPID STREP A, ED / UC: Streptococcus, Group A Screen (Direct): NEGATIVE

## 2020-05-17 LAB — SARS CORONAVIRUS 2 (TAT 6-24 HRS): SARS Coronavirus 2: NEGATIVE

## 2020-05-17 MED ORDER — ALBUTEROL SULFATE HFA 108 (90 BASE) MCG/ACT IN AERS
1.0000 | INHALATION_SPRAY | Freq: Four times a day (QID) | RESPIRATORY_TRACT | 0 refills | Status: DC | PRN
Start: 2020-05-17 — End: 2020-06-11

## 2020-05-17 MED ORDER — GUAIFENESIN ER 600 MG PO TB12
600.0000 mg | ORAL_TABLET | Freq: Two times a day (BID) | ORAL | 0 refills | Status: AC
Start: 2020-05-17 — End: 2020-05-31

## 2020-05-17 MED ORDER — LORATADINE 10 MG PO TABS
10.0000 mg | ORAL_TABLET | Freq: Every day | ORAL | 0 refills | Status: DC
Start: 2020-05-17 — End: 2023-05-18

## 2020-05-17 MED ORDER — FLUTICASONE PROPIONATE 50 MCG/ACT NA SUSP
1.0000 | Freq: Every day | NASAL | 1 refills | Status: DC
Start: 2020-05-17 — End: 2023-05-18

## 2020-05-17 MED ORDER — BENZONATATE 100 MG PO CAPS
100.0000 mg | ORAL_CAPSULE | Freq: Three times a day (TID) | ORAL | 0 refills | Status: DC | PRN
Start: 2020-05-17 — End: 2021-08-08

## 2020-05-17 NOTE — ED Triage Notes (Signed)
Pt presents with nasal congestion, sore throat, and swollen lymph nodes xs 2 weeks on and off.

## 2020-05-17 NOTE — ED Provider Notes (Signed)
[facility]    CSN: [id number] Arrival date & time: 05/17/20  1138      History   Chief Complaint Chief Complaint  Patient presents with  . Nasal Congestion  . Sore Throat    HPI Bailey Delgado is a 44 y.o. female with a history of asthma, half pack a day smoker comes to urgent care with a 2-week history of nasal congestion, cough productive of clear sputum, shortness of breath and wheezing as well as lymph node swelling in the neck.  Patient said symptoms started insidiously and is gotten progressively worse.  Is associated with a sensation of ear fullness and decreased hearing especially out of the right ear.  No fever or chills.  No loss of taste or smell.  No nausea or vomiting.  Patient has some chest tightness and shortness of breath.  No chest pain or chest pressure.Marland Kitchen   HPI  Past Medical History:  Diagnosis Date  . Arthritis   . Asthma     Patient Active Problem List   Diagnosis Date Noted  . Anxiety and depression 03/15/2020  . SAB (spontaneous abortion) 03/15/2020    Past Surgical History:  Procedure Laterality Date  . CESAREAN SECTION    . HERNIA REPAIR      OB History    Gravida  4   Para  1   Term      Preterm  1   AB  1   Living  1     SAB  1   TAB      Ectopic      Multiple      Live Births  2            Home Medications    Prior to Admission medications   Medication Sig Start Date End Date Taking? Authorizing Provider  albuterol (VENTOLIN HFA) 108 (90 Base) MCG/ACT inhaler Inhale 1 puff into the lungs every 6 (six) hours as needed for wheezing or shortness of breath. 05/17/20   Ryver Zadrozny, Britta Mccreedy, MD  benzonatate (TESSALON) 100 MG capsule Take 1 capsule (100 mg total) by mouth 3 (three) times daily as needed for cough. 05/17/20   Merrilee Jansky, MD  fluticasone (FLONASE) 50 MCG/ACT nasal spray Place 1 spray into both nostrils daily. 05/17/20   Shelvy Heckert, Britta Mccreedy, MD  guaiFENesin (MUCINEX) 600 MG 12 hr tablet Take 1  tablet (600 mg total) by mouth 2 (two) times daily for 14 days. 05/17/20 05/31/20  Merrilee Jansky, MD  loratadine (CLARITIN) 10 MG tablet Take 1 tablet (10 mg total) by mouth daily. 05/17/20   Merrilee Jansky, MD  FLUoxetine (PROZAC) 20 MG capsule Take 1 capsule (20 mg total) by mouth daily. Increase to 2 tablets daily after 7 days. 03/15/20 05/17/20  Sheila Oats, MD    Family History History reviewed. No pertinent family history.  Social History Social History   Tobacco Use  . Smoking status: Current Every Day Smoker    Packs/day: 0.50    Types: Cigarettes  . Smokeless tobacco: Never Used  Substance Use Topics  . Alcohol use: Not Currently  . Drug use: Not Currently    Types: Marijuana     Allergies   Patient has no known allergies.   Review of Systems Review of Systems  Constitutional: Negative for activity change, chills, fatigue and fever.  HENT: Positive for congestion, postnasal drip and rhinorrhea. Negative for sneezing, sore throat and voice change.   Respiratory: Positive for  cough, chest tightness and shortness of breath. Negative for wheezing.   Cardiovascular: Negative for chest pain.  Gastrointestinal: Negative.   Neurological: Negative.      Physical Exam Triage Vital Signs ED Triage Vitals  Enc Vitals Group     BP 05/17/20 1204 130/79     Pulse Rate 05/17/20 1204 85     Resp 05/17/20 1204 20     Temp 05/17/20 1204 98.3 F (36.8 C)     Temp Source 05/17/20 1204 Oral     SpO2 05/17/20 1204 98 %     Weight --      Height --      Head Circumference --      Peak Flow --      Pain Score 05/17/20 1203 0     Pain Loc --      Pain Edu? --      Excl. in GC? --    No data found.  Updated Vital Signs BP 130/79 (BP Location: Left Arm)   Pulse 85   Temp 98.3 F (36.8 C) (Oral)   Resp 20   LMP 11/23/2019 (Approximate)   SpO2 98%   Visual Acuity Right Eye Distance:   Left Eye Distance:   Bilateral Distance:    Right Eye Near:   Left Eye  Near:    Bilateral Near:     Physical Exam Vitals and nursing note reviewed.  Constitutional:      General: She is not in acute distress.    Appearance: She is not ill-appearing.  HENT:     Mouth/Throat:     Mouth: Mucous membranes are moist.     Pharynx: Posterior oropharyngeal erythema present.     Tonsils: Tonsillar exudate present. 1+ on the right. 2+ on the left.  Eyes:     Extraocular Movements:     Right eye: Normal extraocular motion.     Left eye: Normal extraocular motion.     Conjunctiva/sclera: Conjunctivae normal.  Cardiovascular:     Rate and Rhythm: Normal rate and regular rhythm.  Pulmonary:     Effort: No respiratory distress.     Breath sounds: Normal breath sounds. No stridor. No wheezing, rhonchi or rales.  Abdominal:     General: Bowel sounds are normal.     Palpations: Abdomen is soft.  Neurological:     Mental Status: She is alert.      UC Treatments / Results  Labs (all labs ordered are listed, but only abnormal results are displayed) Labs Reviewed  SARS CORONAVIRUS 2 (TAT 6-24 HRS)  POCT RAPID STREP A, ED / UC    EKG   Radiology No results found.  Procedures Procedures (including critical care time)  Medications Ordered in UC Medications - No data to display  Initial Impression / Assessment and Plan / UC Course  I have reviewed the triage vital signs and the nursing notes.  Pertinent labs & imaging results that were available during my care of the patient were reviewed by me and considered in my medical decision making (see chart for details).    1.  Mild intermittent asthma with acute exacerbation: Patient currently has no wheezing so no steroids will be prescribed Albuterol inhaler Tessalon Perles as needed for cough No indication for antibiotics at this time  2.  Seasonal allergic rhinitis with postnasal drip: Flonase Mucinex Tylenol as needed for pain Patient is smoker.  Smoke cessation counseling.  Patient is in the  precontemplative state.  Time spent on  counseling is less than 10 minutes. Final Clinical Impressions(s) / UC Diagnoses   Final diagnoses:  Mild intermittent asthma with acute exacerbation  Seasonal allergic rhinitis, unspecified trigger   Discharge Instructions   None    ED Prescriptions    Medication Sig Dispense Auth. Provider   fluticasone (FLONASE) 50 MCG/ACT nasal spray Place 1 spray into both nostrils daily. 16 g Merrilee Jansky, MD   loratadine (CLARITIN) 10 MG tablet Take 1 tablet (10 mg total) by mouth daily. 60 tablet Anikin Prosser, Britta Mccreedy, MD   albuterol (VENTOLIN HFA) 108 (90 Base) MCG/ACT inhaler Inhale 1 puff into the lungs every 6 (six) hours as needed for wheezing or shortness of breath. 18 g Merrilee Jansky, MD   guaiFENesin (MUCINEX) 600 MG 12 hr tablet Take 1 tablet (600 mg total) by mouth 2 (two) times daily for 14 days. 28 tablet Zadyn Yardley, Britta Mccreedy, MD   benzonatate (TESSALON) 100 MG capsule Take 1 capsule (100 mg total) by mouth 3 (three) times daily as needed for cough. 21 capsule Jaquan Sadowsky, Britta Mccreedy, MD     PDMP not reviewed this encounter.   Merrilee Jansky, MD 05/17/20 1236

## 2020-05-19 LAB — CULTURE, GROUP A STREP (THRC)

## 2020-06-11 ENCOUNTER — Emergency Department (HOSPITAL_COMMUNITY): Payer: Medicaid Other

## 2020-06-11 ENCOUNTER — Encounter (HOSPITAL_COMMUNITY): Payer: Self-pay | Admitting: Pediatrics

## 2020-06-11 ENCOUNTER — Emergency Department (HOSPITAL_COMMUNITY)
Admission: EM | Admit: 2020-06-11 | Discharge: 2020-06-11 | Disposition: A | Discharge status: Home / Self Care | Payer: Medicaid Other | Attending: Emergency Medicine | Admitting: Emergency Medicine

## 2020-06-11 ENCOUNTER — Other Ambulatory Visit: Payer: Self-pay

## 2020-06-11 DIAGNOSIS — J45909 Unspecified asthma, uncomplicated: Secondary | ICD-10-CM | POA: Diagnosis not present

## 2020-06-11 DIAGNOSIS — Z20822 Contact with and (suspected) exposure to covid-19: Secondary | ICD-10-CM | POA: Insufficient documentation

## 2020-06-11 DIAGNOSIS — J40 Bronchitis, not specified as acute or chronic: Secondary | ICD-10-CM

## 2020-06-11 DIAGNOSIS — F17210 Nicotine dependence, cigarettes, uncomplicated: Secondary | ICD-10-CM | POA: Diagnosis not present

## 2020-06-11 DIAGNOSIS — R590 Localized enlarged lymph nodes: Secondary | ICD-10-CM | POA: Insufficient documentation

## 2020-06-11 DIAGNOSIS — R059 Cough, unspecified: Secondary | ICD-10-CM

## 2020-06-11 DIAGNOSIS — J209 Acute bronchitis, unspecified: Secondary | ICD-10-CM | POA: Insufficient documentation

## 2020-06-11 LAB — RESPIRATORY PANEL BY RT PCR (FLU A&B, COVID)
Influenza A by PCR: NEGATIVE
Influenza B by PCR: NEGATIVE
SARS Coronavirus 2 by RT PCR: NEGATIVE

## 2020-06-11 MED ORDER — DOXYCYCLINE HYCLATE 100 MG PO CAPS: 100.0000 mg | ORAL_CAPSULE | Freq: Two times a day (BID) | ORAL | 0 refills | Status: AC

## 2020-06-11 MED ORDER — ALBUTEROL SULFATE HFA 108 (90 BASE) MCG/ACT IN AERS: 1.0000 | INHALATION_SPRAY | Freq: Four times a day (QID) | RESPIRATORY_TRACT | 0 refills | Status: DC | PRN

## 2020-06-11 MED ORDER — ALBUTEROL SULFATE HFA 108 (90 BASE) MCG/ACT IN AERS
2.0000 | INHALATION_SPRAY | Freq: Once | RESPIRATORY_TRACT | Status: AC
  Administered 2020-06-11: 2 via RESPIRATORY_TRACT
  Filled 2020-06-11: qty 6.7

## 2020-06-11 NOTE — ED Triage Notes (Signed)
C/O cough, congestion and headache ongoing for 1 month;

## 2020-06-11 NOTE — Discharge Instructions (Addendum)
Strongly recommend follow-up with a primary care doctor.  You can call [facility].  Recommend completing course of doxycycline.  Use inhaler as needed for wheezing.  Recommend Tylenol and Motrin for pain.  If you develop difficulty breathing, chest pain, worsening headache, vomiting, or other new concerning symptom, return to ER for reassessment.

## 2020-06-11 NOTE — ED Provider Notes (Signed)
[facility] EMERGENCY DEPARTMENT Provider Note   CSN: [id number] Arrival date & time: 06/11/20  1613     History Chief Complaint  Patient presents with  . Cough    Bailey Delgado is a 44 y.o. female.  Presents to ER with concern for cough, congestion, headache, rash.  She reports that over the past month or so she has been having nasal congestion, sinus congestion, dull achy headache as well as cough.  Cough is dry, nonproductive.  No fevers associated.  Headache is mild to moderate, frontal, achy.  Not sudden onset, not worst headache of her life.  She has not taken any medication for her symptoms today.  Reports that she had gone to urgent care previously and had tried some over-the-counter cold medicine with no relief.  Reports that she has a history of asthma, does not currently have an inhaler at home.  Smokes.  Also reports over the last couple weeks she has noted rash to her feet.  HPI     Past Medical History:  Diagnosis Date  . Arthritis   . Asthma     Patient Active Problem List   Diagnosis Date Noted  . Anxiety and depression 03/15/2020  . SAB (spontaneous abortion) 03/15/2020    Past Surgical History:  Procedure Laterality Date  . CESAREAN SECTION    . HERNIA REPAIR       OB History    Gravida  4   Para  1   Term      Preterm  1   AB  1   Living  1     SAB  1   TAB      Ectopic      Multiple      Live Births  2           No family history on file.  Social History   Tobacco Use  . Smoking status: Current Every Day Smoker    Packs/day: 0.50    Types: Cigarettes  . Smokeless tobacco: Never Used  Substance Use Topics  . Alcohol use: Not Currently  . Drug use: Not Currently    Types: Marijuana    Home Medications Prior to Admission medications   Medication Sig Start Date End Date Taking? Authorizing Provider  albuterol (VENTOLIN HFA) 108 (90 Base) MCG/ACT inhaler Inhale 1 puff into the lungs every 6 (six)  hours as needed for wheezing or shortness of breath. 06/11/20   Milagros Loll, MD  benzonatate (TESSALON) 100 MG capsule Take 1 capsule (100 mg total) by mouth 3 (three) times daily as needed for cough. 05/17/20   Lamptey, Britta Mccreedy, MD  doxycycline (VIBRAMYCIN) 100 MG capsule Take 1 capsule (100 mg total) by mouth 2 (two) times daily for 7 days. 06/11/20 06/18/20  Milagros Loll, MD  fluticasone (FLONASE) 50 MCG/ACT nasal spray Place 1 spray into both nostrils daily. 05/17/20   Merrilee Jansky, MD  loratadine (CLARITIN) 10 MG tablet Take 1 tablet (10 mg total) by mouth daily. 05/17/20   Merrilee Jansky, MD  FLUoxetine (PROZAC) 20 MG capsule Take 1 capsule (20 mg total) by mouth daily. Increase to 2 tablets daily after 7 days. 03/15/20 05/17/20  Sheila Oats, MD    Allergies    Patient has no known allergies.  Review of Systems   Review of Systems  Constitutional: Negative for chills and fever.  HENT: Positive for congestion and sinus pain. Negative for ear pain and sore  throat.   Eyes: Negative for pain and visual disturbance.  Respiratory: Positive for cough. Negative for shortness of breath.   Cardiovascular: Negative for chest pain and palpitations.  Gastrointestinal: Negative for abdominal pain and vomiting.  Genitourinary: Negative for dysuria and hematuria.  Musculoskeletal: Negative for arthralgias and back pain.  Skin: Negative for color change and rash.  Neurological: Negative for seizures and syncope.  All other systems reviewed and are negative.   Physical Exam Updated Vital Signs BP (!) 144/101 (BP Location: Right Arm)   Pulse 96   Temp 98.6 F (37 C) (Oral)   Resp 16   LMP 05/26/2020   SpO2 99%   Physical Exam Vitals and nursing note reviewed.  Constitutional:      General: She is not in acute distress.    Appearance: She is well-developed.  HENT:     Head: Normocephalic and atraumatic.     Right Ear: Tympanic membrane and external ear normal.     Left  Ear: Tympanic membrane and external ear normal.     Nose: Congestion present.     Mouth/Throat:     Mouth: Mucous membranes are moist.     Pharynx: Oropharynx is clear. No oropharyngeal exudate or posterior oropharyngeal erythema.  Eyes:     Conjunctiva/sclera: Conjunctivae normal.  Neck:     Comments: Bilateral cervical lymphadenopathy Cardiovascular:     Rate and Rhythm: Normal rate and regular rhythm.     Heart sounds: No murmur heard.   Pulmonary:     Effort: Pulmonary effort is normal. No respiratory distress.     Comments: Very mild end expiratory wheeze bilaterally, no crackles, no increased work of breathing Abdominal:     Palpations: Abdomen is soft.     Tenderness: There is no abdominal tenderness.  Musculoskeletal:        General: No deformity or signs of injury.     Cervical back: Neck supple.  Lymphadenopathy:     Cervical: Cervical adenopathy present.  Skin:    General: Skin is warm and dry.     Capillary Refill: Capillary refill takes less than 2 seconds.     Comments: Occasional blanchable areas of erythema less than 1 cm in diameter to soles of feet  Neurological:     General: No focal deficit present.     Mental Status: She is alert.  Psychiatric:        Mood and Affect: Mood normal.        Behavior: Behavior normal.     ED Results / Procedures / Treatments   Labs (all labs ordered are listed, but only abnormal results are displayed) Labs Reviewed  RESPIRATORY PANEL BY RT PCR (FLU A&B, COVID)    EKG None  Radiology DG Chest 2 View  Result Date: 06/11/2020 CLINICAL DATA:  Cough and sore throat for 1 month EXAM: CHEST - 2 VIEW COMPARISON:  None. FINDINGS: The heart size and mediastinal contours are within normal limits. Both lungs are clear. The visualized skeletal structures are unremarkable. IMPRESSION: No active cardiopulmonary disease. Electronically Signed   By: Alcide Clever M.D.   On: 06/11/2020 17:50    Procedures Procedures (including  critical care time)  Medications Ordered in ED Medications  albuterol (VENTOLIN HFA) 108 (90 Base) MCG/ACT inhaler 2 puff (has no administration in time range)    ED Course  I have reviewed the triage vital signs and the nursing notes.  Pertinent labs & imaging results that were available during my care of  the patient were reviewed by me and considered in my medical decision making (see chart for details).    MDM Rules/Calculators/A&P                         44 year old lady presenting to the emergency room with multiple concerns including headache, cough, congestion, rash.  On physical exam, patient remarkably well-appearing in no distress with normal vital signs.  Did note very mild wheeze on exam.  Noted a couple small areas of slight erythema to her feet, no induration or swelling to suggest abscess.  Suspect symptoms are related to underlying viral syndrome.  Given duration, significant congestion, will cover with course of antibiotics for possibility of bacterial cause.  For mild wheeze, will give inhaler.  Recommended follow-up with a primary.   After the discussed management above, the patient was determined to be safe for discharge.  The patient was in agreement with this plan and all questions regarding their care were answered.  ED return precautions were discussed and the patient will return to the ED with any significant worsening of condition.  Final Clinical Impression(s) / ED Diagnoses Final diagnoses:  Cough  Bronchitis  Suspected COVID-19 virus infection    Rx / DC Orders ED Discharge Orders         Ordered    albuterol (VENTOLIN HFA) 108 (90 Base) MCG/ACT inhaler  Every 6 hours PRN        06/11/20 2134    doxycycline (VIBRAMYCIN) 100 MG capsule  2 times daily        06/11/20 2134           Milagros Loll, MD 06/11/20 2145

## 2020-08-15 ENCOUNTER — Telehealth: Payer: Self-pay | Admitting: Clinical

## 2020-08-15 NOTE — Telephone Encounter (Signed)
error 

## 2020-11-25 ENCOUNTER — Emergency Department
Admission: EM | Admit: 2020-11-25 | Discharge: 2020-11-25 | Disposition: A | Discharge status: Home / Self Care | Payer: Medicaid Other | Attending: Emergency Medicine | Admitting: Emergency Medicine

## 2020-11-25 ENCOUNTER — Encounter: Payer: Self-pay | Admitting: Radiology

## 2020-11-25 ENCOUNTER — Other Ambulatory Visit: Payer: Self-pay

## 2020-11-25 ENCOUNTER — Emergency Department: Payer: Medicaid Other

## 2020-11-25 DIAGNOSIS — R1032 Left lower quadrant pain: Secondary | ICD-10-CM | POA: Diagnosis not present

## 2020-11-25 DIAGNOSIS — R112 Nausea with vomiting, unspecified: Secondary | ICD-10-CM | POA: Diagnosis not present

## 2020-11-25 DIAGNOSIS — R197 Diarrhea, unspecified: Secondary | ICD-10-CM | POA: Insufficient documentation

## 2020-11-25 DIAGNOSIS — J45909 Unspecified asthma, uncomplicated: Secondary | ICD-10-CM | POA: Insufficient documentation

## 2020-11-25 DIAGNOSIS — Z7952 Long term (current) use of systemic steroids: Secondary | ICD-10-CM | POA: Diagnosis not present

## 2020-11-25 DIAGNOSIS — F17210 Nicotine dependence, cigarettes, uncomplicated: Secondary | ICD-10-CM | POA: Insufficient documentation

## 2020-11-25 DIAGNOSIS — R109 Unspecified abdominal pain: Secondary | ICD-10-CM | POA: Diagnosis present

## 2020-11-25 LAB — COMPREHENSIVE METABOLIC PANEL
ALT: 183 U/L — ABNORMAL HIGH (ref 0–44)
AST: 46 U/L — ABNORMAL HIGH (ref 15–41)
Albumin: 4 g/dL (ref 3.5–5.0)
Alkaline Phosphatase: 111 U/L (ref 38–126)
Anion gap: 5 (ref 5–15)
BUN: 11 mg/dL (ref 6–20)
CO2: 20 mmol/L — ABNORMAL LOW (ref 22–32)
Calcium: 8.4 mg/dL — ABNORMAL LOW (ref 8.9–10.3)
Chloride: 111 mmol/L (ref 98–111)
Creatinine, Ser: 0.57 mg/dL (ref 0.44–1.00)
GFR, Estimated: >60 mL/min (ref >60)
Glucose, Bld: 102 mg/dL — ABNORMAL HIGH (ref 70–99)
Potassium: 4.1 mmol/L (ref 3.5–5.1)
Sodium: 136 mmol/L (ref 135–145)
Total Bilirubin: 0.6 mg/dL (ref 0.3–1.2)
Total Protein: 7.2 g/dL (ref 6.5–8.1)

## 2020-11-25 LAB — URINALYSIS, COMPLETE (UACMP) WITH MICROSCOPIC
Bilirubin Urine: NEGATIVE
Glucose, UA: NEGATIVE mg/dL
Ketones, ur: NEGATIVE mg/dL
Leukocytes,Ua: NEGATIVE
Nitrite: NEGATIVE
Protein, ur: NEGATIVE mg/dL
Specific Gravity, Urine: 1.029 (ref 1.005–1.030)
pH: 5 (ref 5.0–8.0)

## 2020-11-25 LAB — POC URINE PREG, ED: Preg Test, Ur: NEGATIVE

## 2020-11-25 LAB — CBC
HCT: 41.6 % (ref 36.0–46.0)
Hemoglobin: 13.3 g/dL (ref 12.0–15.0)
MCH: 28.7 pg (ref 26.0–34.0)
MCHC: 32 g/dL (ref 30.0–36.0)
MCV: 89.8 fL (ref 80.0–100.0)
Platelets: 227 10*3/uL (ref 150–400)
RBC: 4.63 MIL/uL (ref 3.87–5.11)
RDW: 14.2 % (ref 11.5–15.5)
WBC: 5.6 10*3/uL (ref 4.0–10.5)
nRBC: 0 % (ref 0.0–0.2)

## 2020-11-25 LAB — LIPASE, BLOOD: Lipase: 31 U/L (ref 11–51)

## 2020-11-25 MED ORDER — IOHEXOL 300 MG/ML  SOLN
100.0000 mL | Freq: Once | INTRAMUSCULAR | Status: AC | PRN
  Administered 2020-11-25: 100 mL via INTRAVENOUS

## 2020-11-25 MED ORDER — ONDANSETRON HCL 4 MG/2ML IJ SOLN
4.0000 mg | Freq: Once | INTRAMUSCULAR | Status: AC
  Administered 2020-11-25: 4 mg via INTRAVENOUS
  Filled 2020-11-25: qty 2

## 2020-11-25 MED ORDER — MORPHINE SULFATE (PF) 2 MG/ML IV SOLN
2.0000 mg | Freq: Once | INTRAVENOUS | Status: AC
  Administered 2020-11-25: 2 mg via INTRAVENOUS
  Filled 2020-11-25: qty 1

## 2020-11-25 MED ORDER — DIPHENHYDRAMINE HCL 50 MG/ML IJ SOLN: 25.0000 mg | Freq: Once | INTRAMUSCULAR | Status: AC

## 2020-11-25 MED ORDER — SODIUM CHLORIDE 0.9 % IV BOLUS
1000.0000 mL | Freq: Once | INTRAVENOUS | Status: AC
  Administered 2020-11-25: 1000 mL via INTRAVENOUS

## 2020-11-25 MED ORDER — ONDANSETRON 4 MG PO TBDP: 4.0000 mg | ORAL_TABLET | Freq: Three times a day (TID) | ORAL | 0 refills | Status: DC | PRN

## 2020-11-25 MED ORDER — DIPHENHYDRAMINE HCL 50 MG/ML IJ SOLN
INTRAMUSCULAR | Status: AC
  Administered 2020-11-25: 25 mg via INTRAVENOUS
  Filled 2020-11-25: qty 1

## 2020-11-25 NOTE — ED Notes (Signed)
Pt to CT at this time.

## 2020-11-25 NOTE — ED Provider Notes (Signed)
[facility] Emergency Department Provider Note   ____________________________________________   Event Date/Time   First MD Initiated Contact with Patient 11/25/20 [phone]     (approximate)  I have reviewed the triage vital signs and the nursing notes.   HISTORY  Chief Complaint Abdominal Pain    HPI Bailey Delgado is a 45 y.o. female who presents to the ED from home with a chief complaint of abdominal pain, nausea/vomiting/diarrhea.  Onset of symptoms 2 days ago.  Denies associated fever, chills, cough, chest pain, shortness of breath, dysuria.  Denies COVID-19 exposure.     Past Medical History:  Diagnosis Date  . Arthritis   . Asthma     Patient Active Problem List   Diagnosis Date Noted  . Anxiety and depression 03/15/2020  . SAB (spontaneous abortion) 03/15/2020    Past Surgical History:  Procedure Laterality Date  . CESAREAN SECTION    . HERNIA REPAIR      Prior to Admission medications   Medication Sig Start Date End Date Taking? Authorizing Provider  albuterol (VENTOLIN HFA) 108 (90 Base) MCG/ACT inhaler Inhale 1 puff into the lungs every 6 (six) hours as needed for wheezing or shortness of breath. 06/11/20   Milagros Loll, MD  benzonatate (TESSALON) 100 MG capsule Take 1 capsule (100 mg total) by mouth 3 (three) times daily as needed for cough. 05/17/20   Merrilee Jansky, MD  fluticasone (FLONASE) 50 MCG/ACT nasal spray Place 1 spray into both nostrils daily. 05/17/20   Merrilee Jansky, MD  loratadine (CLARITIN) 10 MG tablet Take 1 tablet (10 mg total) by mouth daily. 05/17/20   Merrilee Jansky, MD  FLUoxetine (PROZAC) 20 MG capsule Take 1 capsule (20 mg total) by mouth daily. Increase to 2 tablets daily after 7 days. 03/15/20 05/17/20  Sheila Oats, MD    Allergies Patient has no known allergies.  No family history on file.  Social History Social History   Tobacco Use  . Smoking status: Current Every Day Smoker     Packs/day: 0.50    Types: Cigarettes  . Smokeless tobacco: Never Used  Substance Use Topics  . Alcohol use: Not Currently  . Drug use: Not Currently    Types: Marijuana    Review of Systems  Constitutional: No fever/chills Eyes: No visual changes. ENT: No sore throat. Cardiovascular: Denies chest pain. Respiratory: Denies shortness of breath. Gastrointestinal: Positive for abdominal pain, nausea, vomiting and diarrhea.  No constipation. Genitourinary: Negative for dysuria. Musculoskeletal: Negative for back pain. Skin: Negative for rash. Neurological: Negative for headaches, focal weakness or numbness.   ____________________________________________   PHYSICAL EXAM:  VITAL SIGNS: ED Triage Vitals  Enc Vitals Group     BP 11/25/20 0424 (!) 131/108     Pulse Rate 11/25/20 0424 93     Resp 11/25/20 0424 16     Temp 11/25/20 0424 98.3 F (36.8 C)     Temp src --      SpO2 11/25/20 0424 100 %     Weight 11/25/20 0425 210 lb (95.3 kg)     Height 11/25/20 0425 5\' 6"  (1.676 m)     Head Circumference --      Peak Flow --      Pain Score 11/25/20 0425 8     Pain Loc --      Pain Edu? --      Excl. in GC? --     Constitutional: Alert and oriented. Well appearing  and in no acute distress. Eyes: Conjunctivae are normal. PERRL. EOMI. Head: Atraumatic. Nose: No congestion/rhinnorhea. Mouth/Throat: Mucous membranes are mildly dry. Neck: No stridor.   Cardiovascular: Normal rate, regular rhythm. Grossly normal heart sounds.  Good peripheral circulation. Respiratory: Normal respiratory effort.  No retractions. Lungs CTAB. Gastrointestinal: Soft and mildly tender to palpation left lower quadrant without rebound or guarding. No distention. No abdominal bruits. No CVA tenderness. Musculoskeletal: No lower extremity tenderness nor edema.  No joint effusions. Neurologic:  Normal speech and language. No gross focal neurologic deficits are appreciated. No gait instability. Skin:   Skin is warm, dry and intact. No rash noted. Psychiatric: Mood and affect are normal. Speech and behavior are normal.  ____________________________________________   LABS (all labs ordered are listed, but only abnormal results are displayed)  Labs Reviewed  COMPREHENSIVE METABOLIC PANEL - Abnormal; Notable for the following components:      Result Value   CO2 20 (*)    Glucose, Bld 102 (*)    Calcium 8.4 (*)    AST 46 (*)    ALT 183 (*)    All other components within normal limits  URINALYSIS, COMPLETE (UACMP) WITH MICROSCOPIC - Abnormal; Notable for the following components:   Color, Urine YELLOW (*)    APPearance HAZY (*)    Hgb urine dipstick MODERATE (*)    Bacteria, UA RARE (*)    All other components within normal limits  LIPASE, BLOOD  CBC  POC URINE PREG, ED   ____________________________________________  EKG  None ____________________________________________  RADIOLOGY I, Vincenzina Jagoda J, personally viewed and evaluated these images (plain radiographs) as part of my medical decision making, as well as reviewing the written report by the radiologist.  ED MD interpretation: Pending  Official radiology report(s): No results found.  ____________________________________________   PROCEDURES  Procedure(s) performed (including Critical Care):  Procedures   ____________________________________________   INITIAL IMPRESSION / ASSESSMENT AND PLAN / ED COURSE  As part of my medical decision making, I reviewed the following data within the electronic MEDICAL RECORD NUMBER Nursing notes reviewed and incorporated, Labs reviewed, Old chart reviewed and Notes from prior ED visits     45 year old female presenting with lower abdominal pain, nausea/vomiting/diarrhea. Differential diagnosis includes, but is not limited to, ovarian cyst, ovarian torsion, acute appendicitis, diverticulitis, urinary tract infection/pyelonephritis, endometriosis, bowel obstruction, colitis, renal  colic, gastroenteritis, hernia, etc.  Laboratory and UA results remarkable for mild elevation of transaminases which could be secondary to vomiting.  Will initiate IV fluid resuscitation, IV morphine for pain paired with IV Zofran for nausea.  We will proceed to CT abdomen/pelvis to evaluate etiology of patient's symptoms.  Will reassess. Clinical Course as of 11/25/20 0653  Mon Nov 25, 2020  0300 Patient tolerated oral contrast without emesis.  Pending CT scan.  Care will be transferred at change of shift to the oncoming provider.  Anticipate patient may be discharged home if CT scan is unremarkable. [JS]    Clinical Course User Index [JS] Irean Hong, MD     ____________________________________________   FINAL CLINICAL IMPRESSION(S) / ED DIAGNOSES  Final diagnoses:  Left lower quadrant abdominal pain  Nausea vomiting and diarrhea     ED Discharge Orders    None      *Please note:  Bailey Delgado was evaluated in Emergency Department on 11/25/2020 for the symptoms described in the history of present illness. She was evaluated in the context of the global COVID-19 pandemic, which necessitated consideration that the patient  might be at risk for infection with the SARS-CoV-2 virus that causes COVID-19. Institutional protocols and algorithms that pertain to the evaluation of patients at risk for COVID-19 are in a state of rapid change based on information released by regulatory bodies including the CDC and federal and state organizations. These policies and algorithms were followed during the patient's care in the ED.  Some ED evaluations and interventions may be delayed as a result of limited staffing during and the pandemic.*   Note:  This document was prepared using Dragon voice recognition software and may include unintentional dictation errors.   Irean Hong, MD 11/25/20 [phone]

## 2020-11-25 NOTE — ED Notes (Signed)
Pt verbalizes relief of discomfort at IV site. Pt being taken to CT now.

## 2020-11-25 NOTE — ED Provider Notes (Signed)
Asked to follow-up on CT, overall reassuring, patient is feeling improved, work note provided   Jene Every, MD 11/25/20 [phone]

## 2020-11-25 NOTE — ED Notes (Signed)
Report given to Reina RN 

## 2020-11-25 NOTE — ED Notes (Signed)
Pt brought back from CT prior to going to scan with reports of itching at IV site. IV site red after med admin. Sung MD made aware and orders placed for benadryl IV. Pt denies other complaints. Able to talk in full sentences. Denies SOB

## 2020-11-25 NOTE — ED Notes (Signed)
Paper consent signed for discharge. 

## 2020-11-25 NOTE — ED Notes (Signed)
Pt endorses L sided abd pain since Friday as well as n/v/d. States she is unable to keep anything down. Pt is AAO NAD VSS

## 2020-11-25 NOTE — ED Triage Notes (Signed)
Pt states nausea, vomiting and diarrhea with lower abd pain that began on Friday. Pt with moist oral mucus membranes. Pt states she feels weak. Pt appears in no acute distress.

## 2021-05-28 ENCOUNTER — Other Ambulatory Visit: Payer: Self-pay

## 2021-05-28 ENCOUNTER — Ambulatory Visit: Payer: Self-pay | Admitting: Nurse Practitioner

## 2021-05-28 DIAGNOSIS — F419 Anxiety disorder, unspecified: Secondary | ICD-10-CM

## 2021-05-28 DIAGNOSIS — Z113 Encounter for screening for infections with a predominantly sexual mode of transmission: Secondary | ICD-10-CM

## 2021-05-28 LAB — WET PREP FOR TRICH, YEAST, CLUE
Trichomonas Exam: NEGATIVE
Yeast Exam: NEGATIVE

## 2021-05-28 LAB — HM HEPATITIS C SCREENING LAB: HM Hepatitis Screen: NEGATIVE

## 2021-05-28 LAB — HEPATITIS B SURFACE ANTIGEN

## 2021-05-28 LAB — HM HIV SCREENING LAB: HM HIV Screening: NEGATIVE

## 2021-05-28 NOTE — Progress Notes (Signed)
[facility] Department STI clinic/screening visit  Subjective:  Bailey Delgado is a 45 y.o. female being seen today for an STI screening visit. The patient reports they do have symptoms.  Patient reports that they do desire a pregnancy in the next year.   They reported they are not interested in discussing contraception today.  Patient's last menstrual period was 04/19/2021 (approximate).   Patient has the following medical conditions:   Patient Active Problem List   Diagnosis Date Noted   Anxiety and depression 03/15/2020   SAB (spontaneous abortion) 03/15/2020    Chief Complaint  Patient presents with   STD SCREENING     HPI  Patient reports that she had a positive RPR while attempting to give blood.  Patient states that she currently complains of discharge and irritation.   Last HIV test per patient/review of record was about 3-4 years ago.   Patient reports last pap was greater than 2 years ago.   See flowsheet for further details and programmatic requirements.    The following portions of the patient's history were reviewed and updated as appropriate: allergies, current medications, past medical history, past social history, past surgical history and problem list.  Objective:  There were no vitals filed for this visit.  Physical Exam Constitutional:      Appearance: Normal appearance.  HENT:     Head: Normocephalic and atraumatic.  Pulmonary:     Effort: Pulmonary effort is normal.  Abdominal:     General: Abdomen is flat.     Palpations: Abdomen is soft.  Genitourinary:    General: Normal vulva.     Rectum: Normal.     Comments: External genitalia/pubic area without nits, lice, edema, erythema, lesions and inguinal adenopathy. Vagina with normal mucosa and discharge. Cervix without visible lesions. Uterus firm, mobile, nt, no masses, no CMT, no adnexal tenderness or fullness.  Musculoskeletal:     Cervical back: Normal range of motion and neck  supple.  Lymphadenopathy:     Cervical: Cervical adenopathy present.     Right cervical: Deep cervical adenopathy present.     Left cervical: Deep cervical adenopathy present.  Skin:    General: Skin is warm and dry.  Neurological:     Mental Status: She is alert and oriented to person, place, and time.  Psychiatric:        Mood and Affect: Mood normal.        Behavior: Behavior normal.     Assessment and Plan:  Bailey Delgado is a 45 y.o. female presenting to the [facility] Department for STI screening  1. Screening examination for venereal disease Patient accepted all screenings including oral, vaginal, and rectal CT/GC and bloodwork for HIV/RPR, Hep B, and HCV.  Patient meets criteria for HepB screening? Yes. Ordered? Yes Patient meets criteria for HepC screening? Yes. Ordered? Yes  Treat wet prep per standing order Discussed time line for [facility] results and that patient will be called with positive results and encouraged patient to call if she had not heard in 2 weeks.  Counseled to return or seek care for continued or worsening symptoms Recommended condom use with all sex  Patient is not currently using contraception to prevent pregnancy.    - WET PREP FOR TRICH, YEAST, CLUE - Syphilis Serology, [facility] - HBV Antigen/Antibody [facility] - HIV/HCV [place] [facility] - Chlamydia/Gonorrhea [place] [facility] - Chlamydia/Gonorrhea [place] [facility] - Chlamydia/Gonorrhea [facility]  2. Anxiety  Patient reports a history of depression and anxiety.  Recently moved from [place].  Reports medication management for depression and anxiety in the past.  Referral sent to [facility], LCSW.       Return if symptoms worsen or fail to improve.  No future appointments.  Bailey Fellows, NP

## 2021-05-29 NOTE — Progress Notes (Signed)
FNP asked for me to accompany her for patient's exam.  Present for exam and helped with pelvic exam.  External genitalia/pubic area without nits, lice, edema, erythema, lesions and inguinal adenopathy. Vagina with normal mucosa and discharge. Cervix without visible lesions. Uterus firm, mobile, nt, no masses, no CMT, uterus feels upper limits of normal size, no adnexal tenderness or fullness.

## 2021-06-11 ENCOUNTER — Other Ambulatory Visit: Payer: Self-pay

## 2021-06-11 ENCOUNTER — Ambulatory Visit: Payer: Medicaid Other | Admitting: Physician Assistant

## 2021-06-11 DIAGNOSIS — Z113 Encounter for screening for infections with a predominantly sexual mode of transmission: Secondary | ICD-10-CM

## 2021-06-11 DIAGNOSIS — Z792 Long term (current) use of antibiotics: Secondary | ICD-10-CM

## 2021-06-11 MED ORDER — PENICILLIN G BENZATHINE 1200000 UNIT/2ML IM SUSY
2.4000 10*6.[IU] | PREFILLED_SYRINGE | Freq: Once | INTRAMUSCULAR | Status: AC
  Administered 2021-06-11: 2.4 10*6.[IU] via INTRAMUSCULAR

## 2021-06-11 NOTE — Progress Notes (Signed)
S:  Patient into clinic to follow up on Syphilis test results.  Patient states that she is not allergic to anything.  Reports that she was never diagnosed with Syphilis in the past and denies any symptoms.   O:  WDWN female in NAD, A&O x 3, normal work of breathing;  RPR=non-reactive, Syphilis TP=reactive. A/P: 1.  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  Counseled that it is rare for the confirmatory test to be a false positive. 2.  Counseled that we recommend that we repeat the Syphilis blood work today and decide on further treatment after the results are back. 3.  Offered patient treatment today with Bicillin 2.4 mu IM while awaiting today's test results. 4.  Patient opts for treatment today.  Bicillin 2.4 mu IM to be given today. 5.  No sex for 14 days and until after test results from today are back. 6.  RN to review with patient normal SE of treatment including all over rash, soreness at injection sites, chills, low grade fever, etc. 7.  Recommend that patient use warm compresses and OTC analgesics as needed to help relieve side effects. 8.  Recommend condoms with all sex 3.  RTC prn.

## 2021-06-11 NOTE — Progress Notes (Signed)
Pt here for STD screening.  Bicillin 2.4 MU given IM without any complications.  Berdie Ogren, RN

## 2021-08-08 ENCOUNTER — Emergency Department
Admission: EM | Admit: 2021-08-08 | Discharge: 2021-08-08 | Disposition: A | Discharge status: Home / Self Care | Payer: Medicaid Other | Attending: Emergency Medicine | Admitting: Emergency Medicine

## 2021-08-08 ENCOUNTER — Other Ambulatory Visit: Payer: Self-pay

## 2021-08-08 DIAGNOSIS — R0981 Nasal congestion: Secondary | ICD-10-CM | POA: Diagnosis present

## 2021-08-08 DIAGNOSIS — B349 Viral infection, unspecified: Secondary | ICD-10-CM | POA: Diagnosis not present

## 2021-08-08 DIAGNOSIS — Z20822 Contact with and (suspected) exposure to covid-19: Secondary | ICD-10-CM | POA: Insufficient documentation

## 2021-08-08 DIAGNOSIS — F17210 Nicotine dependence, cigarettes, uncomplicated: Secondary | ICD-10-CM | POA: Diagnosis not present

## 2021-08-08 DIAGNOSIS — J45909 Unspecified asthma, uncomplicated: Secondary | ICD-10-CM | POA: Diagnosis not present

## 2021-08-08 LAB — RESP PANEL BY RT-PCR (FLU A&B, COVID) ARPGX2
Influenza A by PCR: NEGATIVE
Influenza B by PCR: NEGATIVE
SARS Coronavirus 2 by RT PCR: NEGATIVE

## 2021-08-08 MED ORDER — PREDNISONE 50 MG PO TABS: 50.0000 mg | ORAL_TABLET | Freq: Every day | ORAL | 0 refills | Status: DC

## 2021-08-08 MED ORDER — PSEUDOEPH-BROMPHEN-DM 30-2-10 MG/5ML PO SYRP: 10.0000 mL | ORAL_SOLUTION | Freq: Four times a day (QID) | ORAL | 0 refills | Status: DC | PRN

## 2021-08-08 MED ORDER — BENZONATATE 100 MG PO CAPS: 100.0000 mg | ORAL_CAPSULE | Freq: Three times a day (TID) | ORAL | 0 refills | Status: AC | PRN

## 2021-08-08 NOTE — ED Provider Notes (Signed)
Emergency Medicine Provider Triage Evaluation Note  Bailey Delgado , a 45 y.o. female  was evaluated in triage.  Pt complains of chills, bodyaches, cold sweats, cough, x 2 days. Co-worker has Covid..  Review of Systems  Positive: Chills, body aches, sweating Negative: Vomiting or diarrhea  Physical Exam  There were no vitals taken for this visit. Gen:   Awake, no distress   Resp:  Normal effort  MSK:   Moves extremities without difficulty  Other:    Medical Decision Making  Medically screening exam initiated at 6:21 PM.  Appropriate orders placed.  Bailey Delgado was informed that the remainder of the evaluation will be completed by another provider, this initial triage assessment does not replace that evaluation, and the importance of remaining in the ED until their evaluation is complete.     Chinita Pester, FNP 08/08/21 1823    Minna Antis, MD 08/09/21 [phone]

## 2021-08-08 NOTE — ED Triage Notes (Signed)
Pt comes with c/o cough congestions body aches and fever. Pt states positive covid exposure.

## 2021-08-08 NOTE — ED Provider Notes (Signed)
[facility] Emergency Department Provider Note  ____________________________________________  Time seen: Approximately 6:36 PM  I have reviewed the triage vital signs and the nursing notes.   HISTORY  Chief Complaint Cough    HPI Bailey Delgado is a 45 y.o. female who presents to the emergency department for evaluation of COVID or flu.  Patient states that she had a coworker that came to work flu positive, he then cleared flu and was diagnosed with COVID.  She is now having aches, congestion, fevers, chills, cough, sore throat.  Symptoms have been ongoing x2 days.  No difficulty breathing or chest pain.  No GI symptoms.  Multiple over-the-counter medications prior to arrival.       Past Medical History:  Diagnosis Date   Arthritis    Asthma     Patient Active Problem List   Diagnosis Date Noted   Anxiety and depression 03/15/2020   SAB (spontaneous abortion) 03/15/2020    Past Surgical History:  Procedure Laterality Date   CESAREAN SECTION     HERNIA REPAIR      Prior to Admission medications   Medication Sig Start Date End Date Taking? Authorizing Provider  benzonatate (TESSALON PERLES) 100 MG capsule Take 1 capsule (100 mg total) by mouth 3 (three) times daily as needed for cough. 08/08/21 08/08/22 Yes Cyrstal Leitz, Delorise Royals, PA-C  brompheniramine-pseudoephedrine-DM 30-2-10 MG/5ML syrup Take 10 mLs by mouth 4 (four) times daily as needed. 08/08/21  Yes Shanita Kanan, Delorise Royals, PA-C  predniSONE (DELTASONE) 50 MG tablet Take 1 tablet (50 mg total) by mouth daily with breakfast. 08/08/21  Yes Soriyah Osberg, Delorise Royals, PA-C  albuterol (VENTOLIN HFA) 108 (90 Base) MCG/ACT inhaler Inhale 1 puff into the lungs every 6 (six) hours as needed for wheezing or shortness of breath. 06/11/20   Milagros Loll, MD  fluticasone (FLONASE) 50 MCG/ACT nasal spray Place 1 spray into both nostrils daily. Patient not taking: Reported on 05/28/2021 05/17/20   Merrilee Jansky, MD  loratadine (CLARITIN) 10 MG tablet Take 1 tablet (10 mg total) by mouth daily. 05/17/20   Lamptey, Britta Mccreedy, MD  ondansetron (ZOFRAN ODT) 4 MG disintegrating tablet Take 1 tablet (4 mg total) by mouth every 8 (eight) hours as needed. Patient not taking: Reported on 05/28/2021 11/25/20   Jene Every, MD  FLUoxetine (PROZAC) 20 MG capsule Take 1 capsule (20 mg total) by mouth daily. Increase to 2 tablets daily after 7 days. 03/15/20 05/17/20  Sheila Oats, MD    Allergies Other and Morphine and related  No family history on file.  Social History Social History   Tobacco Use   Smoking status: Every Day    Packs/day: 0.20    Types: Cigarettes   Smokeless tobacco: Never  Vaping Use   Vaping Use: Some days   Substances: Nicotine, Flavoring  Substance Use Topics   Alcohol use: Yes    Comment: ocassionally   Drug use: Yes    Frequency: 7.0 times per week    Types: Marijuana     Review of Systems  Constitutional: Positive fever/chills.  Positive for body aches Eyes: No visual changes. No discharge ENT: Positive for nasal congestion Cardiovascular: no chest pain. Respiratory: Positive cough. No SOB. Gastrointestinal: No abdominal pain.  No nausea, no vomiting.  No diarrhea.  No constipation. Musculoskeletal: Negative for musculoskeletal pain. Skin: Negative for rash, abrasions, lacerations, ecchymosis. Neurological: Negative for headaches, focal weakness or numbness.  10 System ROS otherwise negative.  ____________________________________________   PHYSICAL  EXAM:  VITAL SIGNS: ED Triage Vitals  Enc Vitals Group     BP 08/08/21 1823 (!) 146/94     Pulse Rate 08/08/21 1823 67     Resp 08/08/21 1823 18     Temp 08/08/21 1823 97.8 F (36.6 C)     Temp Source 08/08/21 1823 Oral     SpO2 08/08/21 1823 93 %     Weight --      Height --      Head Circumference --      Peak Flow --      Pain Score 08/08/21 1824 6     Pain Loc --      Pain Edu? --       Excl. in GC? --      Constitutional: Alert and oriented. Well appearing and in no acute distress. Eyes: Conjunctivae are normal. PERRL. EOMI. Head: Atraumatic. ENT:      Ears:       Nose: No congestion/rhinnorhea.      Mouth/Throat: Mucous membranes are moist.  Neck: No stridor.  Neck is supple full range of motion. Hematological/Lymphatic/Immunilogical: No cervical lymphadenopathy. Cardiovascular: Normal rate, regular rhythm. Normal S1 and S2.  Good peripheral circulation. Respiratory: Normal respiratory effort without tachypnea or retractions. Lungs CTAB. Good air entry to the bases with no decreased or absent breath sounds. Gastrointestinal: Bowel sounds 4 quadrants. Soft and nontender to palpation. No guarding or rigidity. No palpable masses. No distention. No CVA tenderness. Musculoskeletal: Full range of motion to all extremities. No gross deformities appreciated. Neurologic:  Normal speech and language. No gross focal neurologic deficits are appreciated.  Skin:  Skin is warm, dry and intact. No rash noted. Psychiatric: Mood and affect are normal. Speech and behavior are normal. Patient exhibits appropriate insight and judgement.   ____________________________________________   LABS (all labs ordered are listed, but only abnormal results are displayed)  Labs Reviewed  RESP PANEL BY RT-PCR (FLU A&B, COVID) ARPGX2   ____________________________________________  EKG   ____________________________________________  RADIOLOGY   No results found.  ____________________________________________    PROCEDURES  Procedure(s) performed:    Procedures    Medications - No data to display   ____________________________________________   INITIAL IMPRESSION / ASSESSMENT AND PLAN / ED COURSE  Pertinent labs & imaging results that were available during my care of the patient were reviewed by me and considered in my medical decision making (see chart for  details).  Review of the [place] CSRS was performed in accordance of the [facility] prior to dispensing any controlled drugs.           Patient's diagnosis is consistent with viral illness.  Patient presents with viral URI symptoms.  Patient has been exposed to flu and COVID.  Swab is still pending but patient will follow results on MyChart.  I will write symptom control medications for the patient at this time.  In addition to these medications, Tylenol, Motrin, plenty of fluids and rest at home.  Return precautions discussed with the patient.  Follow-up primary care as needed..  Patient is given ED precautions to return to the ED for any worsening or new symptoms.     ____________________________________________  FINAL CLINICAL IMPRESSION(S) / ED DIAGNOSES  Final diagnoses:  Viral illness      NEW MEDICATIONS STARTED DURING THIS VISIT:  ED Discharge Orders          Ordered    predniSONE (DELTASONE) 50 MG tablet  Daily with breakfast  08/08/21 1920    brompheniramine-pseudoephedrine-DM 30-2-10 MG/5ML syrup  4 times daily PRN        08/08/21 1920    benzonatate (TESSALON PERLES) 100 MG capsule  3 times daily PRN        08/08/21 1920                This chart was dictated using voice recognition software/Dragon. Despite best efforts to proofread, errors can occur which can change the meaning. Any change was purely unintentional.    Racheal Patches, PA-C 08/08/21 1921    Merwyn Katos, MD 08/09/21 [phone]

## 2021-10-21 ENCOUNTER — Emergency Department
Admission: EM | Admit: 2021-10-21 | Discharge: 2021-10-21 | Disposition: A | Discharge status: Home / Self Care | Payer: Medicaid Other | Attending: Emergency Medicine | Admitting: Emergency Medicine

## 2021-10-21 ENCOUNTER — Emergency Department: Payer: Medicaid Other

## 2021-10-21 ENCOUNTER — Other Ambulatory Visit: Payer: Self-pay

## 2021-10-21 DIAGNOSIS — R112 Nausea with vomiting, unspecified: Secondary | ICD-10-CM | POA: Diagnosis present

## 2021-10-21 DIAGNOSIS — Q433 Congenital malformations of intestinal fixation: Secondary | ICD-10-CM

## 2021-10-21 DIAGNOSIS — K529 Noninfective gastroenteritis and colitis, unspecified: Secondary | ICD-10-CM | POA: Insufficient documentation

## 2021-10-21 DIAGNOSIS — N390 Urinary tract infection, site not specified: Secondary | ICD-10-CM

## 2021-10-21 DIAGNOSIS — Z20822 Contact with and (suspected) exposure to covid-19: Secondary | ICD-10-CM | POA: Insufficient documentation

## 2021-10-21 LAB — CBC
HCT: 36.2 % (ref 36.0–46.0)
Hemoglobin: 11.8 g/dL — ABNORMAL LOW (ref 12.0–15.0)
MCH: 28.4 pg (ref 26.0–34.0)
MCHC: 32.6 g/dL (ref 30.0–36.0)
MCV: 87.2 fL (ref 80.0–100.0)
Platelets: 231 10*3/uL (ref 150–400)
RBC: 4.15 MIL/uL (ref 3.87–5.11)
RDW: 13.3 % (ref 11.5–15.5)
WBC: 6.3 10*3/uL (ref 4.0–10.5)
nRBC: 0 % (ref 0.0–0.2)

## 2021-10-21 LAB — COMPREHENSIVE METABOLIC PANEL
ALT: 30 U/L (ref 0–44)
AST: 16 U/L (ref 15–41)
Albumin: 3.8 g/dL (ref 3.5–5.0)
Alkaline Phosphatase: 51 U/L (ref 38–126)
Anion gap: 5 (ref 5–15)
BUN: 12 mg/dL (ref 6–20)
CO2: 26 mmol/L (ref 22–32)
Calcium: 8.7 mg/dL — ABNORMAL LOW (ref 8.9–10.3)
Chloride: 106 mmol/L (ref 98–111)
Creatinine, Ser: 0.73 mg/dL (ref 0.44–1.00)
GFR, Estimated: >60 mL/min (ref >60)
Glucose, Bld: 97 mg/dL (ref 70–99)
Potassium: 3.1 mmol/L — ABNORMAL LOW (ref 3.5–5.1)
Sodium: 137 mmol/L (ref 135–145)
Total Bilirubin: 0.8 mg/dL (ref 0.3–1.2)
Total Protein: 6.6 g/dL (ref 6.5–8.1)

## 2021-10-21 LAB — URINALYSIS, COMPLETE (UACMP) WITH MICROSCOPIC
Bilirubin Urine: NEGATIVE
Glucose, UA: NEGATIVE mg/dL
Hgb urine dipstick: NEGATIVE
Ketones, ur: NEGATIVE mg/dL
Nitrite: POSITIVE — AB
Protein, ur: NEGATIVE mg/dL
Specific Gravity, Urine: 1.021 (ref 1.005–1.030)
WBC, UA: >50 WBC/hpf — ABNORMAL HIGH (ref 0–5)
pH: 5 (ref 5.0–8.0)

## 2021-10-21 LAB — RESP PANEL BY RT-PCR (FLU A&B, COVID) ARPGX2
Influenza A by PCR: NEGATIVE
Influenza B by PCR: NEGATIVE
SARS Coronavirus 2 by RT PCR: NEGATIVE

## 2021-10-21 LAB — LIPASE, BLOOD: Lipase: 37 U/L (ref 11–51)

## 2021-10-21 LAB — PREGNANCY, URINE: Preg Test, Ur: NEGATIVE

## 2021-10-21 MED ORDER — ONDANSETRON 4 MG PO TBDP: 4.0000 mg | ORAL_TABLET | Freq: Three times a day (TID) | ORAL | 0 refills | Status: DC | PRN

## 2021-10-21 MED ORDER — CEPHALEXIN 500 MG PO CAPS: 500.0000 mg | ORAL_CAPSULE | Freq: Three times a day (TID) | ORAL | 0 refills | Status: AC

## 2021-10-21 MED ORDER — SODIUM CHLORIDE 0.9 % IV SOLN
1000.0000 mL | Freq: Once | INTRAVENOUS | Status: AC
  Administered 2021-10-21: 1000 mL via INTRAVENOUS

## 2021-10-21 MED ORDER — ONDANSETRON HCL 4 MG/2ML IJ SOLN
4.0000 mg | Freq: Once | INTRAMUSCULAR | Status: AC
  Administered 2021-10-21: 4 mg via INTRAVENOUS
  Filled 2021-10-21: qty 2

## 2021-10-21 MED ORDER — CEPHALEXIN 500 MG PO CAPS: 500.0000 mg | ORAL_CAPSULE | Freq: Three times a day (TID) | ORAL | 0 refills | Status: DC

## 2021-10-21 MED ORDER — KETOROLAC TROMETHAMINE 30 MG/ML IJ SOLN
30.0000 mg | Freq: Once | INTRAMUSCULAR | Status: AC
  Administered 2021-10-21: 30 mg via INTRAVENOUS
  Filled 2021-10-21: qty 1

## 2021-10-21 MED ORDER — POTASSIUM CHLORIDE CRYS ER 20 MEQ PO TBCR
40.0000 meq | EXTENDED_RELEASE_TABLET | Freq: Once | ORAL | Status: AC
  Administered 2021-10-21: 40 meq via ORAL
  Filled 2021-10-21: qty 2

## 2021-10-21 MED ORDER — ONDANSETRON 4 MG PO TBDP: 4.0000 mg | ORAL_TABLET | Freq: Three times a day (TID) | ORAL | 0 refills | Status: AC | PRN

## 2021-10-21 MED ORDER — IOHEXOL 300 MG/ML  SOLN
100.0000 mL | Freq: Once | INTRAMUSCULAR | Status: AC | PRN
  Administered 2021-10-21: 100 mL via INTRAVENOUS
  Filled 2021-10-21: qty 100

## 2021-10-21 NOTE — ED Notes (Signed)
See triage note  presents with some abd cramping with n/v/d  states sxs' started on Friday  also had some fever over the weekend  afebrile on arrival ?

## 2021-10-21 NOTE — Discharge Instructions (Addendum)
-  Take all of your antibiotics as prescribed. ?-Follow-up with your primary care provider within the next week to ensure symptom improvement. ?-Return to the emergency department anytime if you begin to experience any new or worsening symptoms. ? ?

## 2021-10-21 NOTE — ED Provider Notes (Signed)
?  Physical Exam  ?BP (!) 159/106   Pulse 80   Temp 98.3 ?F (36.8 ?C) (Oral)   Resp 18   Ht 5\' 6"  (1.676 m)   Wt 95.3 kg   LMP 09/23/2021   SpO2 95%   BMI 33.89 kg/m?  ? ?Physical Exam ?General:          Awake, no distress.  ?CV:                  Good peripheral perfusion.  ?Resp:               Normal effort.  ?Abd:                 No distention.  Mild tenderness palpation left lower quadrant and right lower quadrant, no CVA tenderness ?Other:          ? ?Procedures  ?Procedures ? ?ED Course / MDM  ?  ?Medical Decision Making ?Amount and/or Complexity of Data Reviewed ?Labs: ordered. ?Radiology: ordered. ? ?Risk ?Prescription drug management. ? ? ?This patient's care was transferred over to me by Dr. 09/25/2021.  Plan is to follow-up on CT abdomen, urinalysis, and respiratory panel and treat as needed.  Patient is currently stable at this time. ? ?Respiratory panel negative for COVID-19 or influenza. ? ?Urinalysis shows positive nitrites, moderate leukocytes, and few bacteria, suggestive of urinary tract infection. ? 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  Unlikely to be causing her symptoms today, but remains a possible contributing factor.  Patient notified of these findings.  Advised her to follow-up with her primary care provider as needed. ? ?On reexamination, patient states that her pain and nausea has mostly resolved.  We will plan to discharge this patient with prescription for ondansetron and cephalexin ? ? ? ? ?  ?Jene Every, Varney Daily ?10/21/21 1636 ? ?  ?10/23/21, MD ?10/24/21 [phone] ? ?

## 2021-10-21 NOTE — ED Triage Notes (Signed)
Patient to ER via POV with complaints of nausea, vomiting, diarrhea, chills, fevers, and right sided ear pain that started on Saturday. Reports fever broke on Sunday but is still unable to keep down food/ liquid. ? ?Denies urinary symptoms.  ?

## 2021-10-21 NOTE — ED Provider Notes (Signed)
? ?[facility] ?Provider Note ? ? ? Event Date/Time  ? First MD Initiated Contact with Patient 10/21/21 1404   ?  (approximate) ? ? ?History  ? ?Nausea and Otalgia ? ? ?HPI ? ?Bailey Delgado is a 46 y.o. female with no significant past medical history presents with complaints of nausea vomiting diarrhea and abdominal pain.  Patient reports symptoms started 2 to 3 days ago.  She describes monitor and right lower quadrant abdominal pain, she has had nausea and vomiting and diarrhea.  No hematemesis, no blood in stool.  Has not take anything for this.  Did have fever but reports has not in over 24 hours ?  ? ? ?Physical Exam  ? ?Triage Vital Signs: ?ED Triage Vitals  ?Enc Vitals Group  ?   BP 10/21/21 1357 (!) 159/106  ?   Pulse Rate 10/21/21 1357 80  ?   Resp 10/21/21 1357 18  ?   Temp 10/21/21 1357 98.3 ?F (36.8 ?C)  ?   Temp Source 10/21/21 1357 Oral  ?   SpO2 10/21/21 1357 95 %  ?   Weight 10/21/21 1358 95.3 kg (210 lb)  ?   Height 10/21/21 1358 1.676 m (5\' 6" )  ?   Head Circumference --   ?   Peak Flow --   ?   Pain Score 10/21/21 1358 9  ?   Pain Loc --   ?   Pain Edu? --   ?   Excl. in GC? --   ? ? ?Most recent vital signs: ?Vitals:  ? 10/21/21 1357  ?BP: (!) 159/106  ?Pulse: 80  ?Resp: 18  ?Temp: 98.3 ?F (36.8 ?C)  ?SpO2: 95%  ? ? ? ?General: Awake, no distress.  ?CV:  Good peripheral perfusion.  ?Resp:  Normal effort.  ?Abd:  No distention.  Mild tenderness palpation left lower quadrant and right lower quadrant, no CVA tenderness ?Other:   ? ? ?ED Results / Procedures / Treatments  ? ?Labs ?(all labs ordered are listed, but only abnormal results are displayed) ?Labs Reviewed  ?CBC - Abnormal; Notable for the following components:  ?    Result Value  ? Hemoglobin 11.8 (*)   ? All other components within normal limits  ?COMPREHENSIVE METABOLIC PANEL - Abnormal; Notable for the following components:  ? Potassium 3.1 (*)   ? Calcium 8.7 (*)   ? All other components within normal limits   ?RESP PANEL BY RT-PCR (FLU A&B, COVID) ARPGX2  ?LIPASE, BLOOD  ?URINALYSIS, COMPLETE (UACMP) WITH MICROSCOPIC  ?PREGNANCY, URINE  ? ? ? ?EKG ? ? ? ? ?RADIOLOGY ?CT abdomen pelvis pending ? ? ? ?PROCEDURES: ? ?Critical Care performed:  ? ?Procedures ? ? ?MEDICATIONS ORDERED IN ED: ?Medications  ?0.9 %  sodium chloride infusion (1,000 mLs Intravenous New Bag/Given 10/21/21 1446)  ?ondansetron (ZOFRAN) injection 4 mg (4 mg Intravenous Given 10/21/21 1446)  ?ketorolac (TORADOL) 30 MG/ML injection 30 mg (30 mg Intravenous Given 10/21/21 1446)  ? ? ? ?IMPRESSION / MDM / ASSESSMENT AND PLAN / ED COURSE  ?I reviewed the triage vital signs and the nursing notes. ? ?Patient presents with nausea vomiting and diarrhea with abdominal pain as above.  Differential includes viral gastroenteritis which is prevalent in the community this time.  However she does have tenderness which is significant which could indicate colitis or diverticulitis ? ?We will place IV, give IV Zofran, IV toradol, obtain CT abdomen pelvis and lab work ? ?I have asked my  colleague to follow-up on results ? ?CBC is normal ? ? ? ?  ? ? ?FINAL CLINICAL IMPRESSION(S) / ED DIAGNOSES  ? ?Final diagnoses:  ?Gastroenteritis  ? ? ? ?Rx / DC Orders  ? ?ED Discharge Orders   ? ? None  ? ?  ? ? ? ?Note:  This document was prepared using Dragon voice recognition software and may include unintentional dictation errors. ?  ?Jene Every, MD ?10/21/21 1501 ? ?

## 2021-12-16 IMAGING — US US OB < 14 WEEKS - US OB TV
1 series · 15 of 28 positions shown · non-contrast
Comparison: None.

CLINICAL DATA: Spotting lower back pain, positive urine pregnancy
test

EXAM:
OBSTETRIC <14 WK US AND TRANSVAGINAL OB US
TECHNIQUE: Both transabdominal and transvaginal ultrasound examinations were
performed for complete evaluation of the gestation as well as the
maternal uterus, adnexal regions, and pelvic cul-de-sac.
Transvaginal technique was performed to assess early pregnancy.

[Series 1: us ob < 14 weeks - us ob tv · 15 of 137 slices shown]
[im 1/137]
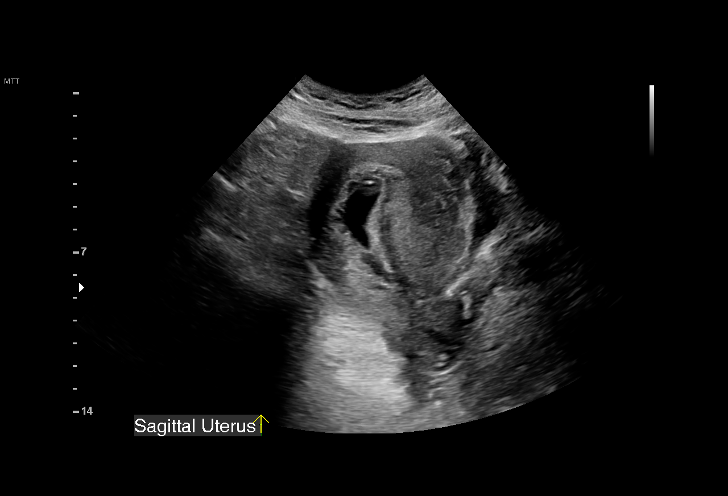
[im 11/137]
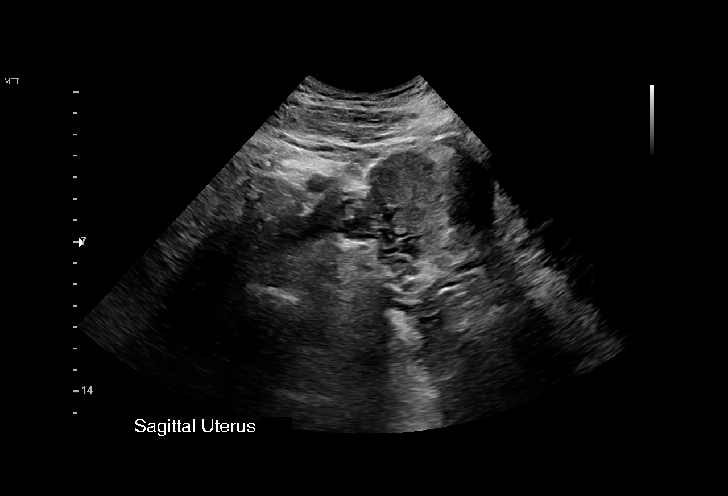
[im 21/137]
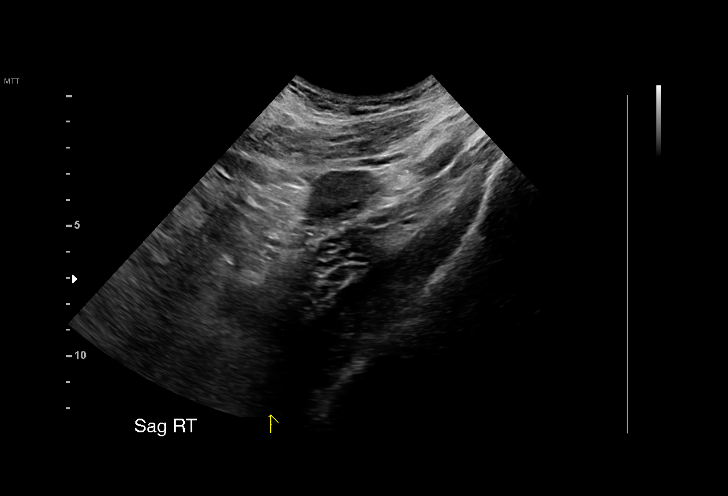
[im 31/137]
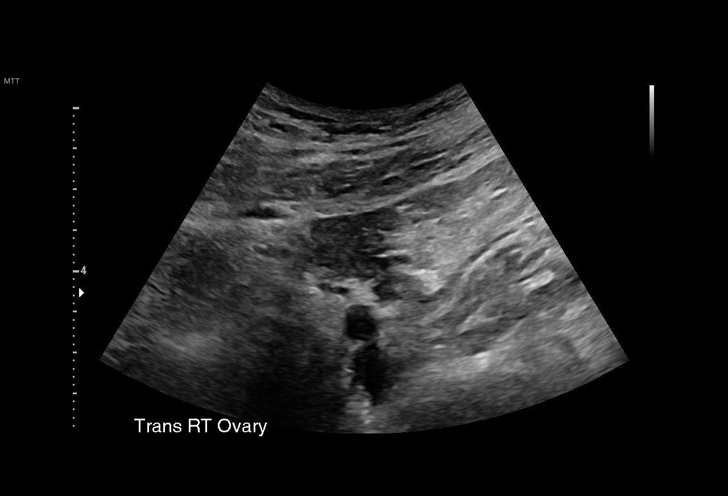
[im 41/137]
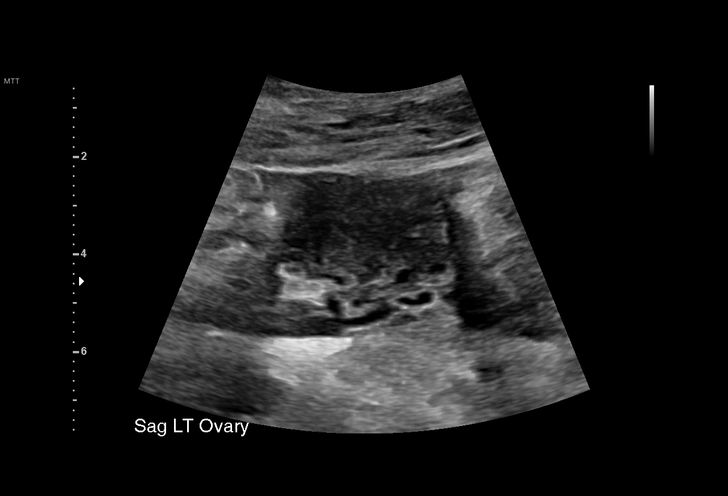
[im 51/137]
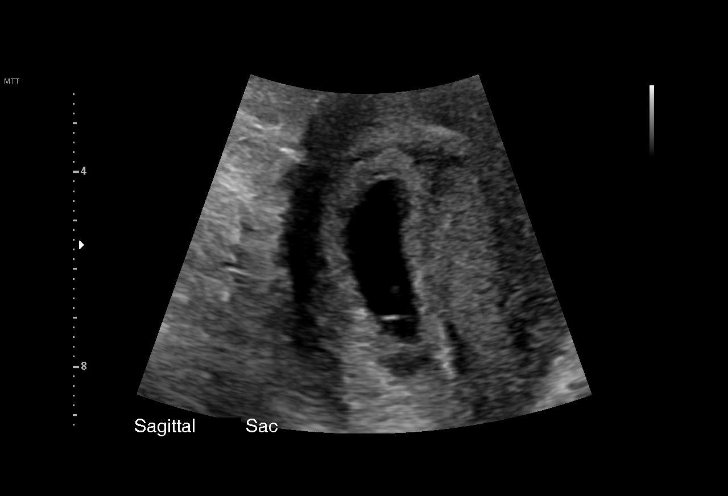
[im 61/137]
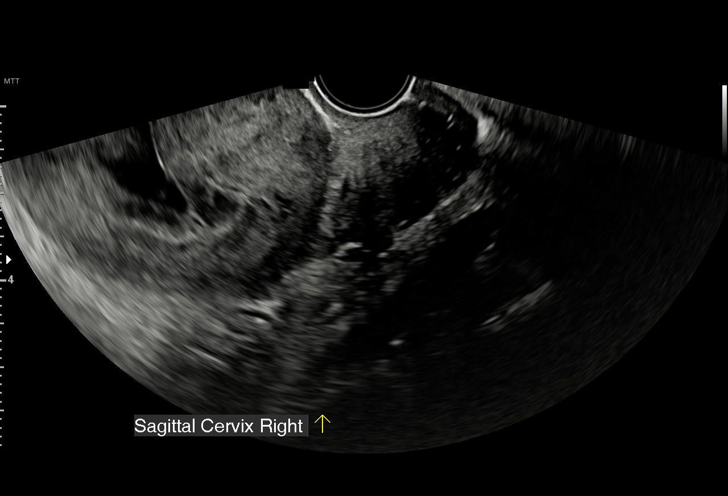
[im 71/137]
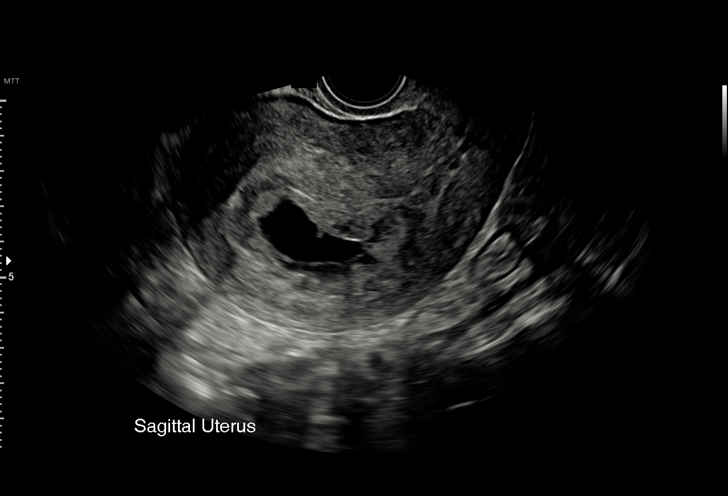
[im 76/137]
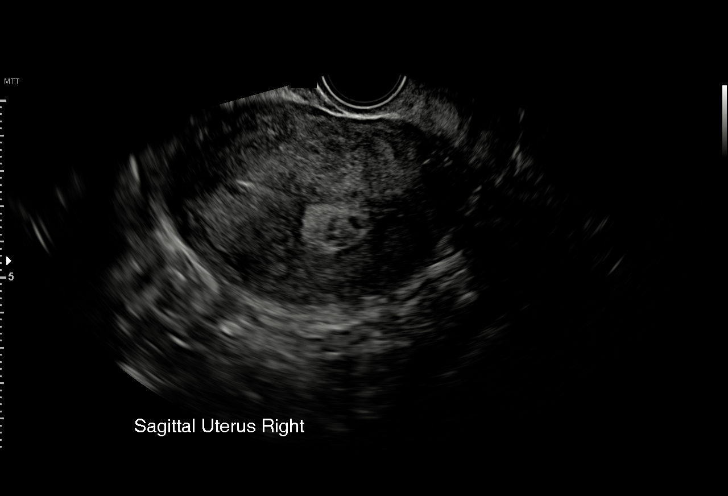
[im 86/137]
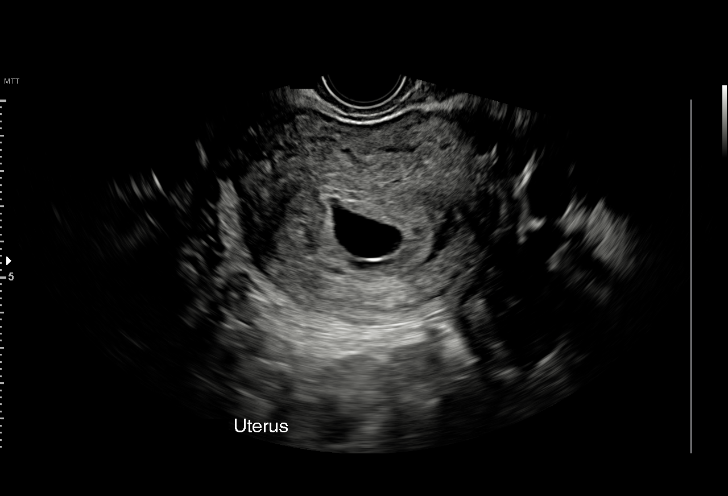
[im 96/137]
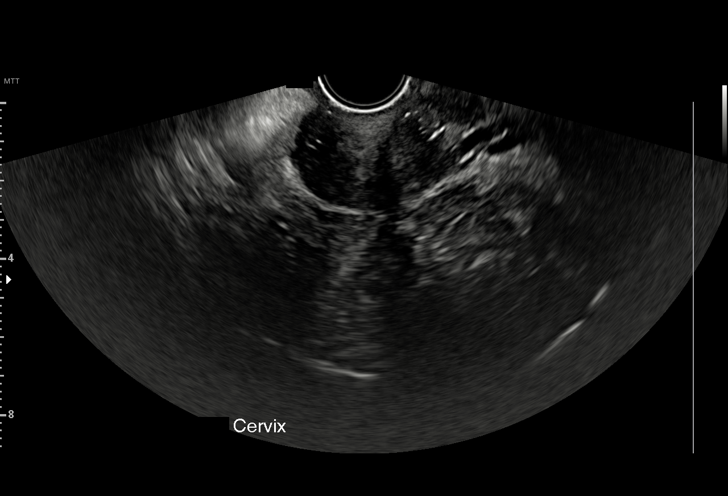
[im 106/137]
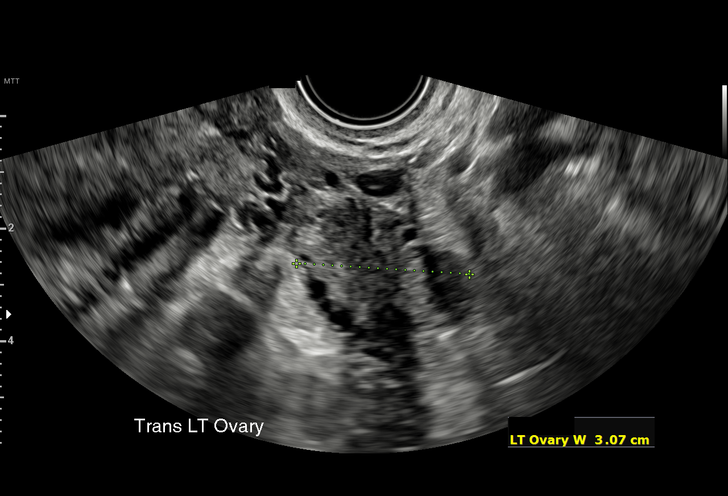
[im 116/137]
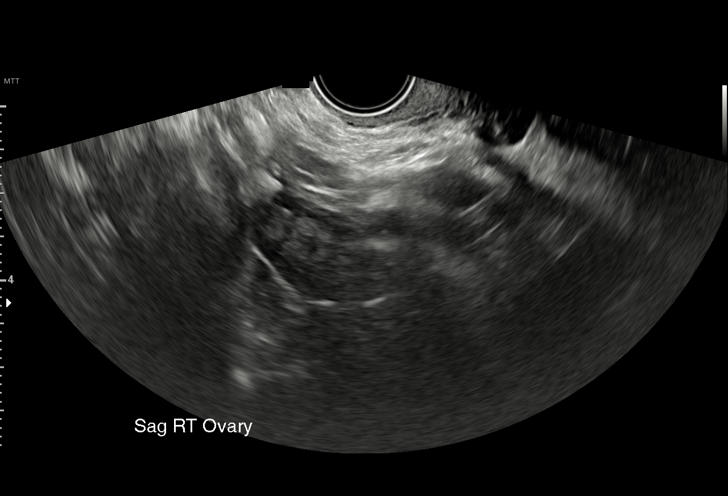
[im 126/137]
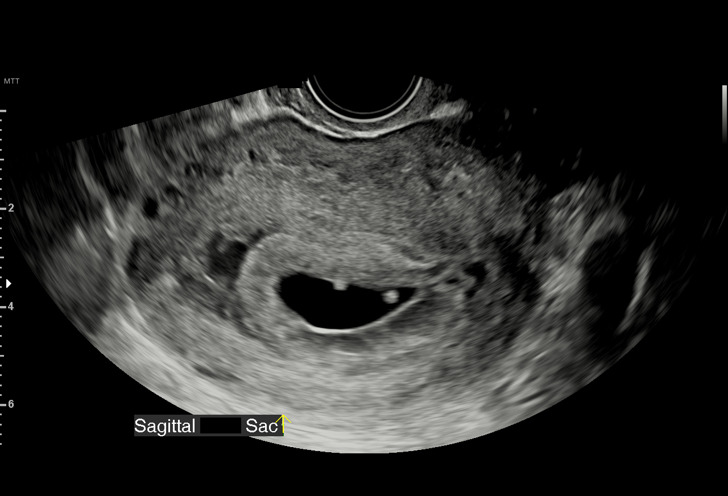
[im 137/137]
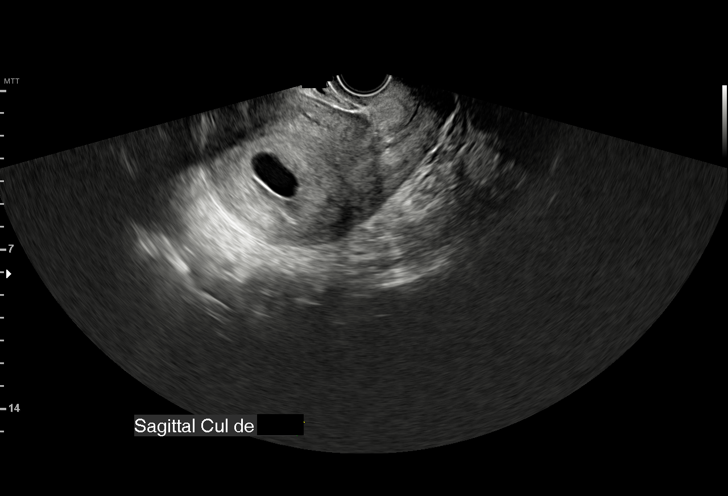

[15 of 28 positions shown; findings below may reference images not displayed]

FINDINGS: Intrauterine gestational sac: Single with internal probable debris.

Yolk sac:  Not Visualized.

Embryo:  Not Visualized.

MSD: 25  mm   7 w   3 d

Subchorionic hemorrhage:  There is a small subchorionic hemorrhage.

Maternal uterus/adnexae: Normal appearing ovaries. No free fluid in
the cul-de-sac.
IMPRESSION: Probable early intrauterine gestational sac, but no yolk sac, fetal
pole, or cardiac activity yet visualized. Recommend follow-up
quantitative B-HCG levels and follow-up US in 14 days to assess
viability. This recommendation follows SRU consensus guidelines:
Diagnostic Criteria for Nonviable Pregnancy Early in the First
Trimester. N Engl J Med 0025; [DATE].

## 2022-02-03 ENCOUNTER — Encounter: Payer: Self-pay | Admitting: Advanced Practice Midwife

## 2022-02-03 ENCOUNTER — Ambulatory Visit (LOCAL_COMMUNITY_HEALTH_CENTER): Payer: Self-pay | Admitting: Advanced Practice Midwife

## 2022-02-03 VITALS — BP 142/92 | Ht 66.0 in | Wt 252.4 lb

## 2022-02-03 DIAGNOSIS — F1411 Cocaine abuse, in remission: Secondary | ICD-10-CM

## 2022-02-03 DIAGNOSIS — F4310 Post-traumatic stress disorder, unspecified: Secondary | ICD-10-CM

## 2022-02-03 DIAGNOSIS — Z3009 Encounter for other general counseling and advice on contraception: Secondary | ICD-10-CM

## 2022-02-03 DIAGNOSIS — F17200 Nicotine dependence, unspecified, uncomplicated: Secondary | ICD-10-CM

## 2022-02-03 DIAGNOSIS — Z720 Tobacco use: Secondary | ICD-10-CM

## 2022-02-03 DIAGNOSIS — F1290 Cannabis use, unspecified, uncomplicated: Secondary | ICD-10-CM | POA: Insufficient documentation

## 2022-02-03 DIAGNOSIS — F319 Bipolar disorder, unspecified: Secondary | ICD-10-CM

## 2022-02-03 DIAGNOSIS — E669 Obesity, unspecified: Secondary | ICD-10-CM

## 2022-02-03 DIAGNOSIS — R030 Elevated blood-pressure reading, without diagnosis of hypertension: Secondary | ICD-10-CM

## 2022-02-03 HISTORY — DX: Post-traumatic stress disorder, unspecified: F43.10

## 2022-02-03 LAB — HM HIV SCREENING LAB: HM HIV Screening: NEGATIVE

## 2022-02-03 MED ORDER — PRENATAL VITAMIN 27-0.8 MG PO TABS: 1.0000 | ORAL_TABLET | Freq: Every day | ORAL | 0 refills | Status: AC

## 2022-02-12 LAB — IGP, APTIMA HPV
HPV Aptima: NEGATIVE
PAP Smear Comment: 0

## 2023-05-10 ENCOUNTER — Ambulatory Visit: Payer: Self-pay

## 2023-05-10 NOTE — Telephone Encounter (Signed)
Summary: real nail peeling off   pt thinks has fungus in toes and hands, is that NT or appt? she says her real nail on ring finger  coming off     Chief Complaint: Nail fungus to fingers and toes Symptoms: Mild discomfort  Frequency: 1-2 weeks Pertinent Negatives: Patient denies any redness Disposition: [] ED /[x] Urgent Care (no appt availability in office) / [] Appointment(In office/virtual)/ []  [facility]/ [] Home Care/ [] Refused Recommended Disposition /[] [facility]/ []  Follow-up with PCP Additional Notes: Pt. Agrees with UC.  Reason for Disposition  [1] MODERATE pain (e.g., interferes with normal activities) AND [2] present > 3 days  Answer Assessment - Initial Assessment Questions 1. ONSET: "When did the pain start?"      Last week 2. LOCATION and RADIATION: "Where is the pain located?"  (e.g., fingertip, around nail, joint, entire  finger)      All nails 3. SEVERITY: "How bad is the pain?" "What does it keep you from doing?"   (Scale 1-10; or mild, moderate, severe)  - MILD (1-3): doesn't interfere with normal activities.   - MODERATE (4-7): interferes with normal activities or awakens from sleep.  - SEVERE (8-10): excruciating pain, unable to hold a glass of water or bend finger even a little.     Mild 4. APPEARANCE: "What does the finger look like?" (e.g., redness, swelling, bruising, pallor)     Nails have fungus 5. WORK OR EXERCISE: "Has there been any recent work or exercise that involved this part (i.e., fingers or hand) of the body?"     No 6. CAUSE: "What do you think is causing the pain?"     Fungus 7. AGGRAVATING FACTORS: "What makes the pain worse?" (e.g., using computer)     No 8. OTHER SYMPTOMS: "Do you have any other symptoms?" (e.g., fever, neck pain, numbness)     No 9. PREGNANCY: "Is there any chance you are pregnant?" "When was your last menstrual period?"     No  Protocols used: Finger Pain-A-AH

## 2023-05-18 ENCOUNTER — Emergency Department
Admission: EM | Admit: 2023-05-18 | Discharge: 2023-05-18 | Disposition: A | Discharge status: Home / Self Care | Payer: 59 | Attending: Emergency Medicine | Admitting: Emergency Medicine

## 2023-05-18 ENCOUNTER — Other Ambulatory Visit: Payer: Self-pay

## 2023-05-18 DIAGNOSIS — L03019 Cellulitis of unspecified finger: Secondary | ICD-10-CM | POA: Insufficient documentation

## 2023-05-18 DIAGNOSIS — L03039 Cellulitis of unspecified toe: Secondary | ICD-10-CM | POA: Insufficient documentation

## 2023-05-18 DIAGNOSIS — H1032 Unspecified acute conjunctivitis, left eye: Secondary | ICD-10-CM | POA: Diagnosis not present

## 2023-05-18 MED ORDER — TOBRAMYCIN 0.3 % OP SOLN: 2.0000 [drp] | OPHTHALMIC | 0 refills | Status: AC

## 2023-05-18 NOTE — ED Triage Notes (Signed)
Pt presents to ED with c/o nail fungus after getting her nails done. Pt also has irritation to L eye, pt states her eye issue appeared with nail fungus.

## 2023-05-18 NOTE — ED Provider Notes (Signed)
[facility] Provider Note    Event Date/Time   First MD Initiated Contact with Patient 05/18/23 1529     (approximate)   History   Nail Problem and Eye Problem   HPI  Bailey Delgado is a 47 y.o. female with history of asthma, arthritis, mental disorder presents emergency department with concerns of toenail fungus and fungus on her fingernails after visiting a nail salon.  Patient also has a reddened eye on the left.  With some drainage.  No fever or chills.      Physical Exam   Triage Vital Signs: ED Triage Vitals [05/18/23 1457]  Encounter Vitals Group     BP (!) 135/95     Systolic BP Percentile      Diastolic BP Percentile      Pulse Rate 87     Resp 18     Temp 98.1 F (36.7 C)     Temp Source Oral     SpO2 96 %     Weight 225 lb (102.1 kg)     Height 5\' 6"  (1.676 m)     Head Circumference      Peak Flow      Pain Score 0     Pain Loc      Pain Education      Exclude from Growth Chart     Most recent vital signs: Vitals:   05/18/23 1457  BP: (!) 135/95  Pulse: 87  Resp: 18  Temp: 98.1 F (36.7 C)  SpO2: 96%     General: Awake, no distress.   CV:  Good peripheral perfusion. regular rate and  rhythm Resp:  Normal effort.  Abd:  No distention.   Other:  Nails with fungal appearing white areas on the fingernails, thickened toenail typical of fungus seen on the great toes, left eye with injected conjunctiva, no matting or drainage noted   ED Results / Procedures / Treatments   Labs (all labs ordered are listed, but only abnormal results are displayed) Labs Reviewed - No data to display   EKG     RADIOLOGY     PROCEDURES:   Procedures   MEDICATIONS ORDERED IN ED: Medications - No data to display   IMPRESSION / MDM / ASSESSMENT AND PLAN / ED COURSE  I reviewed the triage vital signs and the nursing notes.                              Differential diagnosis includes, but is not limited to, fungal  infection, conjunctivitis, cellulitis  Patient's presentation is most consistent with acute illness / injury with system symptoms.   I did explain to the patient we cannot start her on oral medication from the ER so she will need to see her doctor or podiatry.  However she can use apple cider vinegar or over-the-counter clotrimazole.  She was given a prescription for tobramycin for the conjunctivitis.  She is to follow-up with her regular doctor.  Given a list of PCPs that she does not have a regular doctor.  Discharged stable condition.      FINAL CLINICAL IMPRESSION(S) / ED DIAGNOSES   Final diagnoses:  Onychia of finger, unspecified laterality  Onychia of toe, unspecified laterality  Acute conjunctivitis of left eye, unspecified acute conjunctivitis type     Rx / DC Orders   ED Discharge Orders          Ordered  tobramycin (TOBREX) 0.3 % ophthalmic solution  Every 4 hours        05/18/23 1608    Ambulatory Referral to Primary Care (Establish Care)       Comments: Chronic medical conditions   05/18/23 1611             Note:  This document was prepared using Dragon voice recognition software and may include unintentional dictation errors.    Faythe Ghee, PA-C 05/18/23 1755    Merwyn Katos, MD 05/18/23 [phone]

## 2023-05-18 NOTE — Discharge Instructions (Signed)
Soak your nails in apple cider vinegar to help with nail fungus Follow up with podiatry for nail fungus Follow up with pcp, following is a list of pcp doctors that are accepting new patients  Please go to the following website to schedule new (and existing) patient appointments:   [email or web address]   The following is a list of primary care offices in the area who are accepting new patients at this time.  Please reach out to one of them directly and let them know you would like to schedule an appointment to follow up on an Emergency Department visit, and/or to establish a new primary care provider (PCP).  There are likely other primary care clinics in the are who are accepting new patients, but this is an excellent place to start:  [facility] Lead physician: Dr Shirlee Latch [address] [place], [place] [address] [phone]  [facility] Lead Physician: Dr Alba Cory [address], [place], [place] [address] [phone]  [facility]  Lead Physician: Dr Olevia Perches [address] [place], [place] [address] [phone]  [facility] Lead Physician: Dr Sofie Hartigan [address] [place], [place] [address] [phone]  [facility] at [facility] Lead Physician: Dr Bari Edward [address] [place], [place], [place] [address] [phone]

## 2023-06-17 ENCOUNTER — Telehealth: Payer: Self-pay

## 2023-06-17 NOTE — Telephone Encounter (Signed)
Transition Care Management Follow-up Telephone Call Date of discharge and from where: 05/18/2023 [facility] How have you been since you were released from the hospital? Patient stated she is feeling better. Any questions or concerns? No  Items Reviewed: Did the pt receive and understand the discharge instructions provided? Yes  Medications obtained and verified? Yes  Other? No  Any new allergies since your discharge? No  Dietary orders reviewed? Yes Do you have support at home? Yes   Follow up appointments reviewed:  PCP Hospital f/u appt confirmed?  Patient stated she has an appointment scheduled with PCP on 06/22/2023.  Scheduled to see  on  @ . Specialist Hospital f/u appt confirmed? No  Scheduled to see  on  @ . Are transportation arrangements needed? No  If their condition worsens, is the pt aware to call PCP or go to the Emergency Dept.? Yes Was the patient provided with contact information for the PCP's office or ED? Yes Was to pt encouraged to call back with questions or concerns? Yes   Sparsh Callens [facility]  [facility] Guide Direct Dial: [phone]  Website: [email or web address]

## 2023-08-13 IMAGING — CT CT ABD-PELV W/ CM
2 of 5 series · 14 of 46 positions shown, 16 images · IV contrast (APPLIED)
Comparison: 11/25/2020

CLINICAL DATA: Abdominal pain. History of nausea vomiting and
diarrhea. Fever and chills.

EXAM:
CT ABDOMEN AND PELVIS WITH CONTRAST
TECHNIQUE: Multidetector CT imaging of the abdomen and pelvis was performed
using the standard protocol following bolus administration of
intravenous contrast.

[Series 4: abdomen 5.0 · axial · 0.91mm/px · z∈[+786,+1236]mm · 11 of 109 slices shown, 13 images]
[im 10/109  soft-tissue]
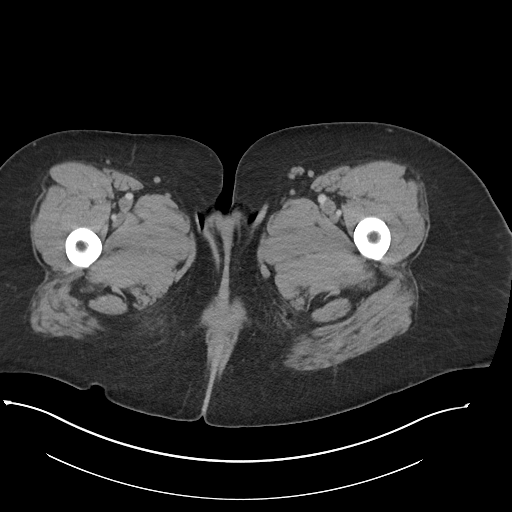
[im 10/109  bone]
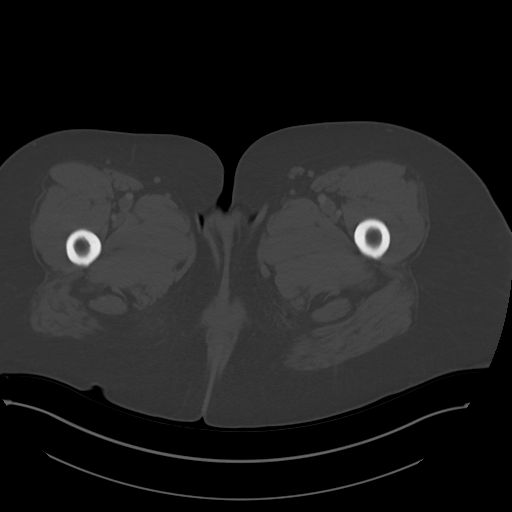
[im 19/109  soft-tissue]
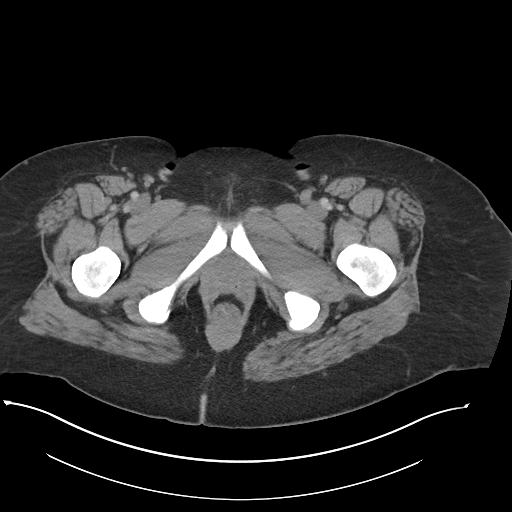
[im 28/109  soft-tissue]
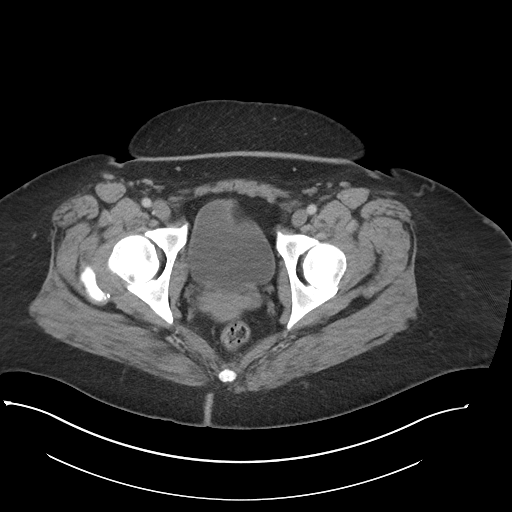
[im 37/109  soft-tissue]
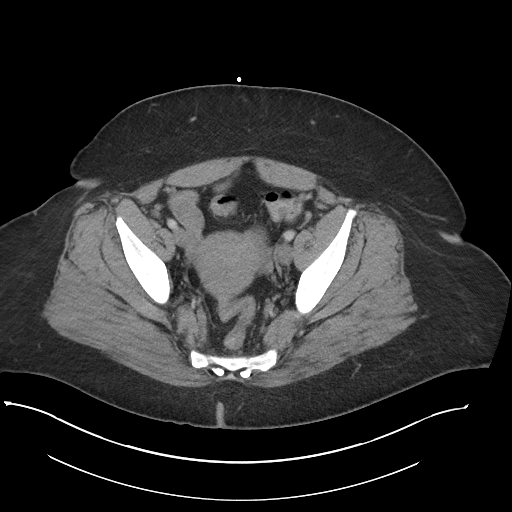
[im 46/109  soft-tissue]
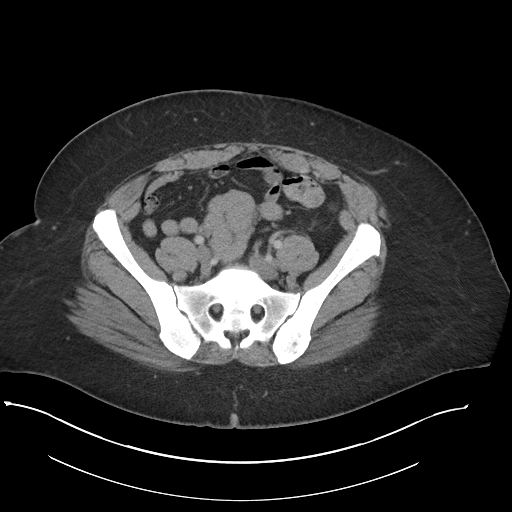
[im 55/109  soft-tissue]
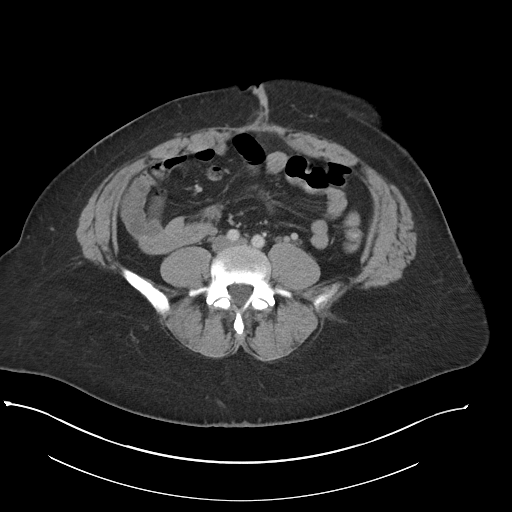
[im 64/109  soft-tissue]
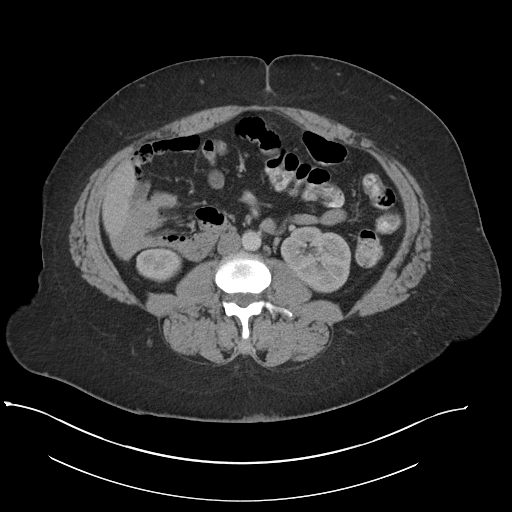
[im 73/109  soft-tissue]
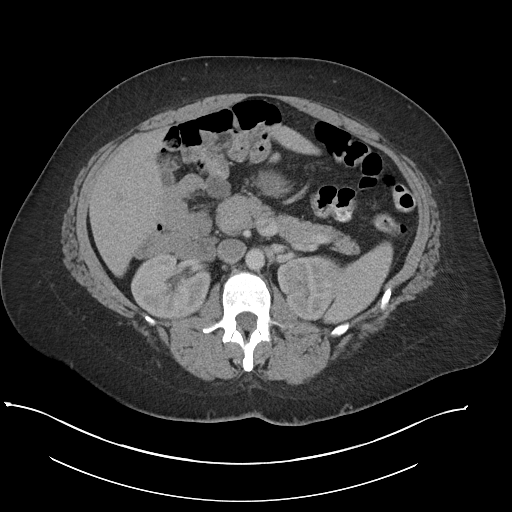
[im 82/109  soft-tissue]
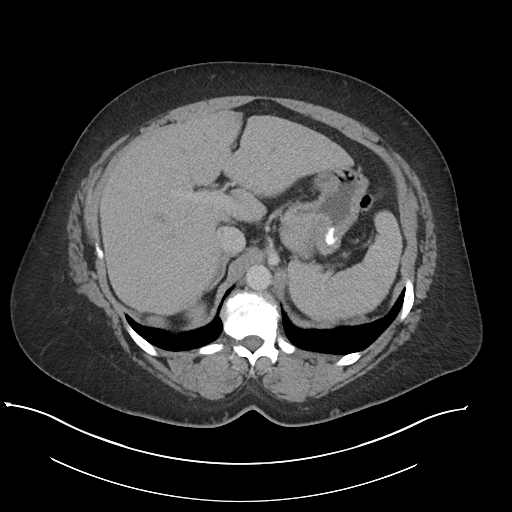
[im 82/109  bone]
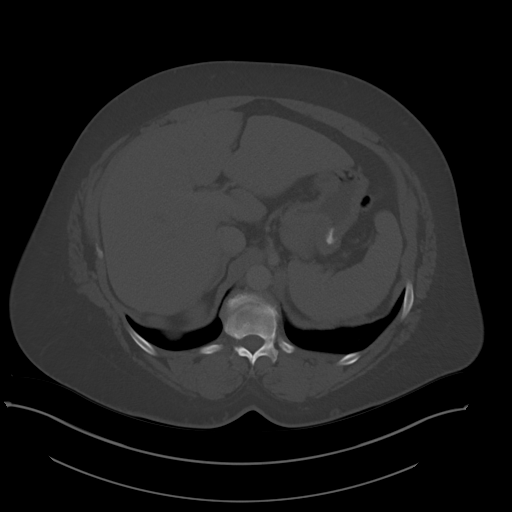
[im 91/109  soft-tissue]
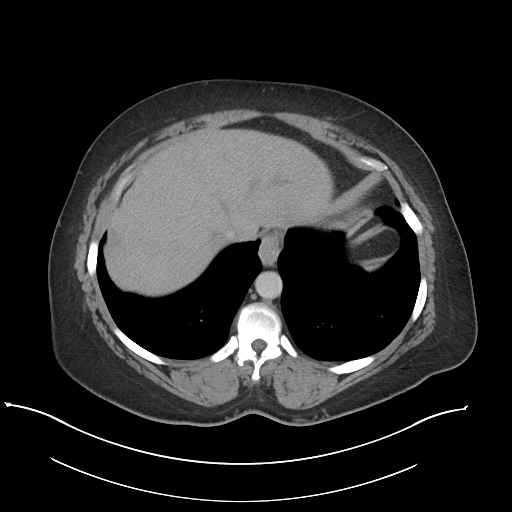
[im 100/109  soft-tissue]
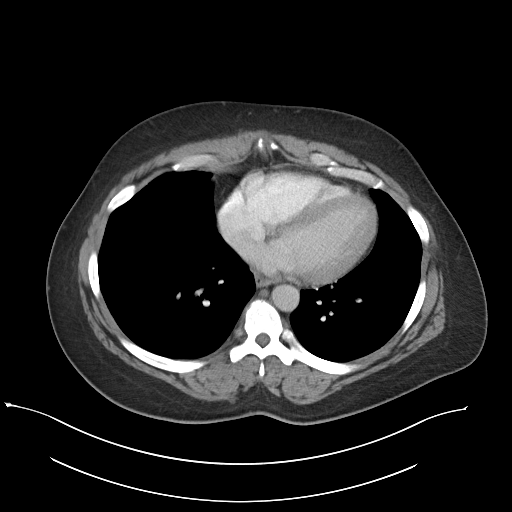

[Series 7: abdomen 3.0 mpr cor · coronal · 0.89mm/px · 3 of 108 slices shown]
[im 36/108  soft-tissue]
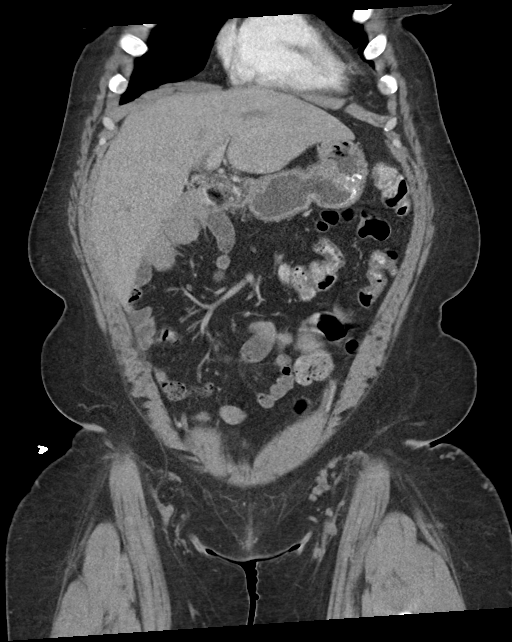
[im 48/108  soft-tissue]
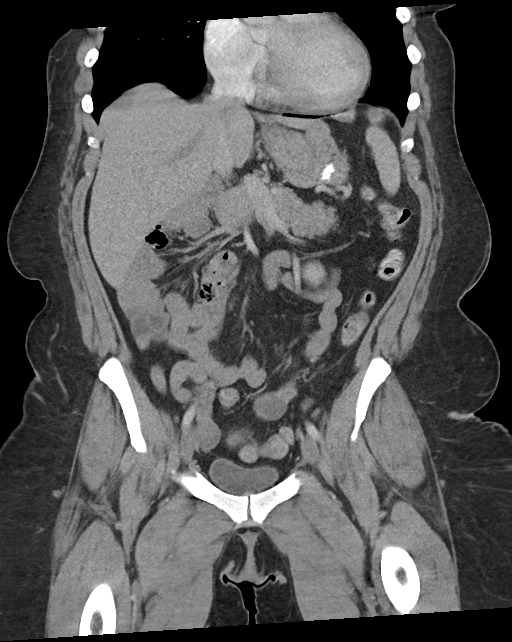
[im 60/108  soft-tissue]
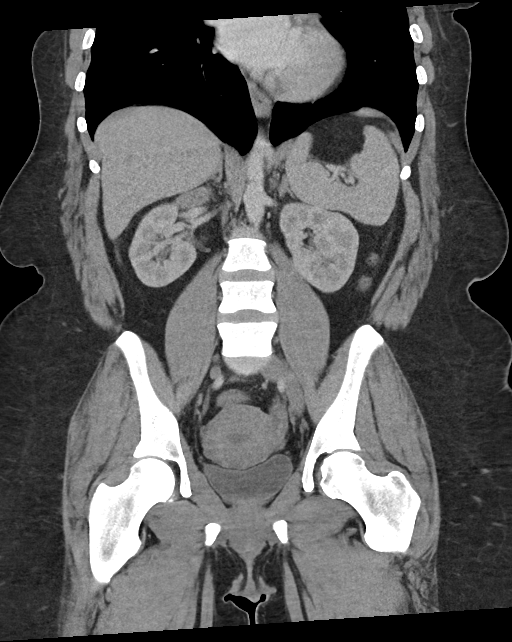

[14 of 46 positions shown; findings below may reference images not displayed]

RADIATION DOSE REDUCTION: This exam was performed according to the
departmental dose-optimization program which includes automated
exposure control, adjustment of the mA and/or kV according to
patient size and/or use of iterative reconstruction technique.

CONTRAST:  100mL OMNIPAQUE IOHEXOL 300 MG/ML  SOLN
FINDINGS: Lower chest: Unremarkable.

Hepatobiliary: No suspicious focal abnormality within the liver
parenchyma. Nonvisualization of the gallbladder may be related to
underdistention or surgical absence. No intrahepatic or extrahepatic
biliary dilation.

Pancreas: No focal mass lesion. No dilatation of the main duct. No
intraparenchymal cyst. No peripancreatic edema.

Spleen: No splenomegaly. No focal mass lesion.

Adrenals/Urinary Tract: No adrenal nodule or mass. Kidneys
unremarkable. No evidence for hydroureter. The urinary bladder
appears normal for the degree of distention.

Stomach/Bowel: Stomach is unremarkable. No gastric wall thickening.
No evidence of outlet obstruction. Duodenum does not cross the
midline as expected. Small bowel loops are concentrated in the right
abdomen with: Concentrated in the left abdomen. The terminal ileum
is normal. The appendix is not well visualized, but there is no
edema or inflammation in the region of the cecum. Cecum is located
in the anterior right upper quadrant just inferior to the liver. No
gross colonic mass. No colonic wall thickening.

Vascular/Lymphatic: No abdominal aortic aneurysm. There is no
gastrohepatic or hepatoduodenal ligament lymphadenopathy. No
retroperitoneal or mesenteric lymphadenopathy. No pelvic sidewall
lymphadenopathy.

Reproductive: The uterus is unremarkable.  There is no adnexal mass.

Other: No intraperitoneal free fluid.

Musculoskeletal: No worrisome lytic or sclerotic osseous
abnormality. Multiple clustered fascial defects in the paraumbilical
midline abdominal wall containing small herniations of
preperitoneal/omental fat. No complicating features.
IMPRESSION: No acute findings in the abdomen or pelvis. Specifically, no
findings to explain the patient's history of abdominal pain with
nausea and vomiting.

Intestinal malrotation without complicating features.

## 2023-11-11 ENCOUNTER — Ambulatory Visit: Payer: Self-pay | Admitting: Internal Medicine

## 2023-11-11 NOTE — Progress Notes (Deleted)
 Subjective:    Patient ID: Bailey Delgado, female    DOB: [birth year], 48 y.o.   MRN: [id number]  HPI  Patient presents to clinic today to establish care and for management of the conditions listed below.  Anxiety, bipolar depression and PTSD: Chronic however she is not taking any medications for this.  She is not currently seeing a therapist or psychiatrist.  She denies SI/HI.  Review of Systems   Past Medical History:  Diagnosis Date   Arthritis    Asthma    Mental disorder    Anxiety, depression, bipolar   Postpartum depression    PTSD (post-traumatic stress disorder) 02/03/2022    Current Outpatient Medications  Medication Sig Dispense Refill   Prenatal Vit-Fe Fumarate-FA (PRENATAL VITAMIN) 27-0.8 MG TABS Take 1 tablet by mouth daily. 100 tablet 0   tobramycin (TOBREX) 0.3 % ophthalmic solution Place 2 drops into the left eye every 4 (four) hours. 5 mL 0   No current facility-administered medications for this visit.    Allergies  Allergen Reactions   Other     Environmental   Morphine And Codeine Itching and Rash    Family History  Problem Relation Age of Onset   Hypertension Mother    Diabetes Mother     Social History   Socioeconomic History   Marital status: Single    Spouse name: Not on file   Number of children: 1   Years of education: 64   Highest education level: 11th grade  Occupational History   Occupation: Chartered loss adjuster  Tobacco Use   Smoking status: Every Day    Current packs/day: 0.20    Types: Cigarettes, Cigars, E-cigarettes    Passive exposure: Current   Smokeless tobacco: Never  Vaping Use   Vaping status: Some Days   Substances: Nicotine, Flavoring  Substance and Sexual Activity   Alcohol use: Yes    Alcohol/week: 4.0 standard drinks of alcohol    Types: 4 Shots of liquor per week    Comment: last use 01/20/22 q 6 mo   Drug use: Yes    Frequency: 7.0 times per week    Types: Marijuana, Cocaine    Comment: last MJ  02/02/22; last cocaine 2021   Sexual activity: Yes    Partners: Male    Birth control/protection: None  Other Topics Concern   Not on file  Social History Narrative   Not on file   Social Drivers of Health   Financial Resource Strain: Not on file  Food Insecurity: Food Insecurity Present (03/15/2020)   Hunger Vital Sign    Worried About Running Out of Food in the Last Year: Sometimes true    Ran Out of Food in the Last Year: Never true  Transportation Needs: Unmet Transportation Needs (03/15/2020)   PRAPARE - Administrator, Civil Service (Medical): No    Lack of Transportation (Non-Medical): Yes  Physical Activity: Not on file  Stress: Not on file  Social Connections: Not on file  Intimate Partner Violence: Not At Risk (02/03/2022)   Humiliation, Afraid, Rape, and Kick questionnaire    Fear of Current or Ex-Partner: No    Emotionally Abused: No    Physically Abused: No    Sexually Abused: No     Constitutional: Denies fever, malaise, fatigue, headache or abrupt weight changes.  HEENT: Denies eye pain, eye redness, ear pain, ringing in the ears, wax buildup, runny nose, nasal congestion, bloody nose, or sore throat. Respiratory: Denies difficulty breathing,  shortness of breath, cough or sputum production.   Cardiovascular: Denies chest pain, chest tightness, palpitations or swelling in the hands or feet.  Gastrointestinal: Denies abdominal pain, bloating, constipation, diarrhea or blood in the stool.  GU: Denies urgency, frequency, pain with urination, burning sensation, blood in urine, odor or discharge. Musculoskeletal: Denies decrease in range of motion, difficulty with gait, muscle pain or joint pain and swelling.  Skin: Denies redness, rashes, lesions or ulcercations.  Neurological: Denies dizziness, difficulty with memory, difficulty with speech or problems with balance and coordination.  Psych: Patient has a history of anxiety and depression.  Denies SI/HI.  No  other specific complaints in a complete review of systems (except as listed in HPI above).      Objective:   Physical Exam  There were no vitals taken for this visit. Wt Readings from Last 3 Encounters:  05/18/23 225 lb (102.1 kg)  02/03/22 252 lb 6.4 oz (114.5 kg)  10/21/21 210 lb (95.3 kg)    General: Appears their stated age, well developed, well nourished in NAD. Skin: Warm, dry and intact. No rashes, lesions or ulcerations noted. HEENT: Head: normal shape and size; Eyes: sclera white, no icterus, conjunctiva pink, PERRLA and EOMs intact; Ears: Tm's gray and intact, normal light reflex; Nose: mucosa pink and moist, septum midline; Throat/Mouth: Teeth present, mucosa pink and moist, no exudate, lesions or ulcerations noted.  Neck:  Neck supple, trachea midline. No masses, lumps or thyromegaly present.  Cardiovascular: Normal rate and rhythm. S1,S2 noted.  No murmur, rubs or gallops noted. No JVD or BLE edema. No carotid bruits noted. Pulmonary/Chest: Normal effort and positive vesicular breath sounds. No respiratory distress. No wheezes, rales or ronchi noted.  Abdomen: Soft and nontender. Normal bowel sounds. No distention or masses noted. Liver, spleen and kidneys non palpable. Musculoskeletal: Normal range of motion. No signs of joint swelling. No difficulty with gait.  Neurological: Alert and oriented. Cranial nerves II-XII grossly intact. Coordination normal.  Psychiatric: Mood and affect normal. Behavior is normal. Judgment and thought content normal.    BMET    Component Value Date/Time   NA 137 10/21/2021 1430   K 3.1 (L) 10/21/2021 1430   CL 106 10/21/2021 1430   CO2 26 10/21/2021 1430   GLUCOSE 97 10/21/2021 1430   BUN 12 10/21/2021 1430   CREATININE 0.73 10/21/2021 1430   CALCIUM 8.7 (L) 10/21/2021 1430   GFRNONAA >60 10/21/2021 1430    Lipid Panel  No results found for: "CHOL", "TRIG", "HDL", "CHOLHDL", "VLDL", "LDLCALC"  CBC    Component Value Date/Time    WBC 6.3 10/21/2021 1430   RBC 4.15 10/21/2021 1430   HGB 11.8 (L) 10/21/2021 1430   HCT 36.2 10/21/2021 1430   PLT 231 10/21/2021 1430   MCV 87.2 10/21/2021 1430   MCH 28.4 10/21/2021 1430   MCHC 32.6 10/21/2021 1430   RDW 13.3 10/21/2021 1430    Hgb A1C No results found for: "HGBA1C"          Assessment & Plan:    RTC in 1 year for your annual exam Nicki Reaper, NP

## 2023-11-19 ENCOUNTER — Emergency Department: Payer: Self-pay

## 2023-11-19 ENCOUNTER — Emergency Department
Admission: EM | Admit: 2023-11-19 | Discharge: 2023-11-19 | Disposition: A | Discharge status: Home / Self Care | Payer: Self-pay | Attending: Emergency Medicine | Admitting: Emergency Medicine

## 2023-11-19 ENCOUNTER — Other Ambulatory Visit: Payer: Self-pay

## 2023-11-19 DIAGNOSIS — M25511 Pain in right shoulder: Secondary | ICD-10-CM | POA: Insufficient documentation

## 2023-11-19 DIAGNOSIS — M62838 Other muscle spasm: Secondary | ICD-10-CM | POA: Insufficient documentation

## 2023-11-19 MED ORDER — ACETAMINOPHEN 325 MG PO TABS
650.0000 mg | ORAL_TABLET | Freq: Once | ORAL | Status: AC
  Administered 2023-11-19: 650 mg via ORAL
  Filled 2023-11-19: qty 2

## 2023-11-19 MED ORDER — PREDNISONE 20 MG PO TABS
50.0000 mg | ORAL_TABLET | Freq: Once | ORAL | Status: AC
  Administered 2023-11-19: 50 mg via ORAL
  Filled 2023-11-19: qty 3

## 2023-11-19 MED ORDER — KETOROLAC TROMETHAMINE 30 MG/ML IJ SOLN
30.0000 mg | Freq: Once | INTRAMUSCULAR | Status: AC
  Administered 2023-11-19: 30 mg via INTRAMUSCULAR
  Filled 2023-11-19: qty 1

## 2023-11-19 MED ORDER — METHOCARBAMOL 1000 MG/10ML IJ SOLN
500.0000 mg | Freq: Once | INTRAMUSCULAR | Status: AC
Start: 2023-11-19 — End: 2023-11-19
  Administered 2023-11-19: 500 mg via INTRAMUSCULAR
  Filled 2023-11-19: qty 5

## 2023-11-19 NOTE — ED Provider Notes (Signed)
 [place] EMERGENCY DEPARTMENT AT [facility] Provider Note   CSN: [id number] Arrival date & time: 11/19/23  7829     History  Chief Complaint  Patient presents with   Extremity Pain    Bailey Delgado is a 48 y.o. female.  Patient here for extremity pain.  She notes right shoulder pain worse with movement.  This happened after yesterday when she was driving and then removing her bra.  Unsure exactly what caused it also worse with turning head.  Denies any specific trauma.  No fevers or chills.  No prior surgical history to shoulder or neck.   Extremity Pain Pertinent negatives include no chest pain, no headaches and no shortness of breath.       Home Medications Prior to Admission medications   Medication Sig Start Date End Date Taking? Authorizing Provider  Prenatal Vit-Fe Fumarate-FA (PRENATAL VITAMIN) 27-0.8 MG TABS Take 1 tablet by mouth daily. 02/03/22   Sciora, Marjory Signs, CNM  tobramycin (TOBREX) 0.3 % ophthalmic solution Place 2 drops into the left eye every 4 (four) hours. 05/18/23   Fisher, Rufino Coulter, PA-C  FLUoxetine (PROZAC) 20 MG capsule Take 1 capsule (20 mg total) by mouth daily. Increase to 2 tablets daily after 7 days. 03/15/20 05/17/20  Antonina Kleine, MD      Allergies    Other and Morphine and codeine    Review of Systems   Review of Systems  Constitutional:  Negative for chills and fever.  Respiratory:  Negative for shortness of breath.   Cardiovascular:  Negative for chest pain.  Gastrointestinal:  Negative for abdominal distention.  Musculoskeletal:  Positive for arthralgias and myalgias.  Skin:  Negative for wound.  Neurological:  Negative for weakness, numbness and headaches.    Physical Exam Updated Vital Signs BP (!) 180/99 (BP Location: Left Arm)   Pulse 83   Temp 97.7 F (36.5 C) (Oral)   Resp 18   Ht 5\' 6"  (1.676 m)   Wt 102.1 kg   LMP 11/02/2023 (Exact Date)   SpO2 100%   BMI 36.32 kg/m  Physical Exam Vitals reviewed.   Constitutional:      Appearance: Normal appearance.  HENT:     Head: Normocephalic and atraumatic.     Nose: Nose normal.  Cardiovascular:     Pulses: Normal pulses.  Pulmonary:     Effort: Pulmonary effort is normal.  Musculoskeletal:     Cervical back: Normal range of motion.     Comments: Right shoulder diffuse tenderness.  Mild paraspinal muscle spasm.  Full range of motion.  Able to lift overhead.  Pain-free neck range of motion.  No cervical tenderness.  Neurological:     Mental Status: She is alert and oriented to person, place, and time. Mental status is at baseline.  Psychiatric:        Mood and Affect: Mood normal.        Behavior: Behavior normal.     ED Results / Procedures / Treatments   Labs (all labs ordered are listed, but only abnormal results are displayed) Labs Reviewed - No data to display  EKG None  Radiology DG Shoulder Right Result Date: 11/19/2023 CLINICAL DATA:  Right shoulder pain EXAM: RIGHT SHOULDER - 2+ VIEW COMPARISON:  None Available. FINDINGS: There is no evidence of fracture or dislocation. There is no evidence of arthropathy or other focal bone abnormality. Soft tissues are unremarkable. IMPRESSION: Negative. Electronically Signed   By: Phylis Breeding.D.  On: 11/19/2023 20:37    Procedures Procedures    Medications Ordered in ED Medications  ketorolac (TORADOL) 30 MG/ML injection 30 mg (30 mg Intramuscular Given 11/19/23 2006)  methocarbamol (ROBAXIN) injection 500 mg (500 mg Intramuscular Given 11/19/23 2032)  acetaminophen (TYLENOL) tablet 650 mg (650 mg Oral Given 11/19/23 2006)  predniSONE (DELTASONE) tablet 50 mg (50 mg Oral Given 11/19/23 2005)    ED Course/ Medical Decision Making/ A&P                                 Medical Decision Making Patient here for extremity pain right shoulder pain with movement.  No specific trauma.  Also has some muscle spasm mostly paraspinal, rotator cuff region.  No numbness or weakness.  No  midline cervical tenderness not acutely concerned for fracture.  Obtain shoulder x-ray will medicate with anti-inflammatories and muscle relaxers.  X-ray is overall unremarkable.  No fracture or dislocation.  Patient with improvement after muscle relaxers and anti-inflammatories.  I will recommend a sling, follow-up with Ortho.  Given return precautions here.  Amount and/or Complexity of Data Reviewed Radiology: ordered.  Risk OTC drugs. Prescription drug management.           Final Clinical Impression(s) / ED Diagnoses Final diagnoses:  Muscle spasm  Acute pain of right shoulder    Rx / DC Orders ED Discharge Orders     None         Hollie Luria, Kirby Peoples 11/19/23 2105    Jacquie Maudlin, MD 11/20/23 [phone]

## 2023-11-19 NOTE — Discharge Instructions (Signed)
 Please take anti-inflammatory such as ibuprofen 600 mg 3 times daily with food.  Also take Tylenol 650 mg 4 times daily. Use muscle relaxant as directed.  Follow-up with orthopedics for further evaluation.  Return here for new or worse symptoms.

## 2023-11-19 NOTE — ED Triage Notes (Addendum)
 Pt c/o severe right arm pain that started yesterday while getting out of the car. Pt states turning neck, bearing down, and movement causes increased pain. Pt denies injury, no obvious deformity noted. Radial pulse 2+, CMS intact. Pt did not have relief w/ OTC NSAIDS/tylenol. Pt CAOX4, tearful in triage.
